# Patient Record
Sex: Male | Born: 1935 | Race: White | Hispanic: No | Marital: Married | Smoking: Former smoker
Health system: Southern US, Community
[De-identification: ages and names within clinical notes are randomized; demographics above are authoritative.]

## PROBLEM LIST (undated history)

## (undated) DIAGNOSIS — I639 Cerebral infarction, unspecified: Secondary | ICD-10-CM

## (undated) DIAGNOSIS — R079 Chest pain, unspecified: Secondary | ICD-10-CM

## (undated) DIAGNOSIS — G4730 Sleep apnea, unspecified: Secondary | ICD-10-CM

## (undated) DIAGNOSIS — E119 Type 2 diabetes mellitus without complications: Secondary | ICD-10-CM

## (undated) DIAGNOSIS — I209 Angina pectoris, unspecified: Secondary | ICD-10-CM

## (undated) DIAGNOSIS — I679 Cerebrovascular disease, unspecified: Secondary | ICD-10-CM

## (undated) DIAGNOSIS — R0602 Shortness of breath: Secondary | ICD-10-CM

## (undated) DIAGNOSIS — R42 Dizziness and giddiness: Secondary | ICD-10-CM

## (undated) DIAGNOSIS — I10 Essential (primary) hypertension: Secondary | ICD-10-CM

## (undated) HISTORY — PX: CARDIAC CATHETERIZATION: SHX172

## (undated) HISTORY — DX: Dizziness and giddiness: R42

## (undated) HISTORY — DX: Type 2 diabetes mellitus without complications: E11.9

## (undated) HISTORY — DX: Cerebrovascular disease, unspecified: I67.9

---
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
## 1999-04-22 ENCOUNTER — Inpatient Hospital Stay (HOSPITAL_COMMUNITY): Admission: AD | Admit: 1999-04-22 | Discharge: 1999-04-24 | Payer: Self-pay | Admitting: Cardiovascular Disease

## 1999-04-22 ENCOUNTER — Encounter: Payer: Self-pay | Admitting: Cardiovascular Disease

## 2000-09-14 ENCOUNTER — Ambulatory Visit (HOSPITAL_BASED_OUTPATIENT_CLINIC_OR_DEPARTMENT_OTHER): Admission: RE | Admit: 2000-09-14 | Discharge: 2000-09-14 | Payer: Self-pay | Admitting: Cardiovascular Disease

## 2002-03-04 ENCOUNTER — Ambulatory Visit (HOSPITAL_BASED_OUTPATIENT_CLINIC_OR_DEPARTMENT_OTHER): Admission: RE | Admit: 2002-03-04 | Discharge: 2002-03-04 | Payer: Self-pay | Admitting: Cardiovascular Disease

## 2002-04-30 ENCOUNTER — Ambulatory Visit (HOSPITAL_BASED_OUTPATIENT_CLINIC_OR_DEPARTMENT_OTHER): Admission: RE | Admit: 2002-04-30 | Discharge: 2002-04-30 | Payer: Self-pay | Admitting: Internal Medicine

## 2003-03-13 ENCOUNTER — Emergency Department (HOSPITAL_COMMUNITY): Admission: EM | Admit: 2003-03-13 | Discharge: 2003-03-13 | Payer: Self-pay | Admitting: Emergency Medicine

## 2003-03-15 ENCOUNTER — Emergency Department (HOSPITAL_COMMUNITY): Admission: EM | Admit: 2003-03-15 | Discharge: 2003-03-16 | Payer: Self-pay | Admitting: Emergency Medicine

## 2003-03-15 ENCOUNTER — Encounter: Payer: Self-pay | Admitting: Emergency Medicine

## 2003-04-08 ENCOUNTER — Encounter: Admission: RE | Admit: 2003-04-08 | Discharge: 2003-04-08 | Payer: Self-pay | Admitting: Cardiovascular Disease

## 2003-04-08 ENCOUNTER — Encounter: Payer: Self-pay | Admitting: Cardiovascular Disease

## 2003-07-29 ENCOUNTER — Ambulatory Visit (HOSPITAL_BASED_OUTPATIENT_CLINIC_OR_DEPARTMENT_OTHER): Admission: RE | Admit: 2003-07-29 | Discharge: 2003-07-29 | Payer: Self-pay | Admitting: Orthopedic Surgery

## 2003-07-29 ENCOUNTER — Ambulatory Visit (HOSPITAL_COMMUNITY): Admission: RE | Admit: 2003-07-29 | Discharge: 2003-07-29 | Payer: Self-pay | Admitting: Orthopedic Surgery

## 2004-08-09 ENCOUNTER — Inpatient Hospital Stay (HOSPITAL_COMMUNITY): Admission: AD | Admit: 2004-08-09 | Discharge: 2004-08-12 | Payer: Self-pay | Admitting: Cardiovascular Disease

## 2005-06-05 ENCOUNTER — Inpatient Hospital Stay (HOSPITAL_COMMUNITY): Admission: AD | Admit: 2005-06-05 | Discharge: 2005-06-08 | Payer: Self-pay | Admitting: Cardiovascular Disease

## 2005-06-28 ENCOUNTER — Encounter: Admission: RE | Admit: 2005-06-28 | Discharge: 2005-06-28 | Payer: Self-pay | Admitting: Cardiovascular Disease

## 2006-06-25 ENCOUNTER — Inpatient Hospital Stay (HOSPITAL_COMMUNITY): Admission: EM | Admit: 2006-06-25 | Discharge: 2006-06-26 | Payer: Self-pay | Admitting: Emergency Medicine

## 2006-10-22 ENCOUNTER — Encounter: Admission: RE | Admit: 2006-10-22 | Discharge: 2006-10-22 | Payer: Self-pay | Admitting: Cardiovascular Disease

## 2007-07-09 ENCOUNTER — Inpatient Hospital Stay (HOSPITAL_COMMUNITY): Admission: AD | Admit: 2007-07-09 | Discharge: 2007-07-12 | Payer: Self-pay | Admitting: Cardiovascular Disease

## 2009-05-06 HISTORY — PX: OTHER SURGICAL HISTORY: SHX169

## 2009-06-23 ENCOUNTER — Observation Stay (HOSPITAL_COMMUNITY): Admission: AD | Admit: 2009-06-23 | Discharge: 2009-06-25 | Payer: Self-pay | Admitting: Cardiovascular Disease

## 2010-12-07 HISTORY — PX: GALLBLADDER SURGERY: SHX652

## 2010-12-08 ENCOUNTER — Ambulatory Visit
Admission: RE | Admit: 2010-12-08 | Discharge: 2010-12-08 | Disposition: A | Discharge status: Home / Self Care | Payer: Medicare Other | Source: Ambulatory Visit | Attending: Cardiovascular Disease | Admitting: Cardiovascular Disease

## 2010-12-08 ENCOUNTER — Inpatient Hospital Stay (HOSPITAL_COMMUNITY)
Admission: AD | Admit: 2010-12-08 | Discharge: 2010-12-14 | DRG: 419 | Disposition: A | Discharge status: Home / Self Care | Payer: Medicare Other | Source: Ambulatory Visit | Attending: General Surgery | Admitting: General Surgery

## 2010-12-08 ENCOUNTER — Observation Stay (HOSPITAL_COMMUNITY): Payer: Medicare Other

## 2010-12-08 ENCOUNTER — Other Ambulatory Visit: Payer: Self-pay | Admitting: Cardiovascular Disease

## 2010-12-08 DIAGNOSIS — K8020 Calculus of gallbladder without cholecystitis without obstruction: Secondary | ICD-10-CM | POA: Diagnosis present

## 2010-12-08 DIAGNOSIS — I10 Essential (primary) hypertension: Secondary | ICD-10-CM | POA: Diagnosis present

## 2010-12-08 DIAGNOSIS — K859 Acute pancreatitis without necrosis or infection, unspecified: Principal | ICD-10-CM | POA: Diagnosis present

## 2010-12-08 DIAGNOSIS — E669 Obesity, unspecified: Secondary | ICD-10-CM | POA: Diagnosis present

## 2010-12-08 DIAGNOSIS — G4733 Obstructive sleep apnea (adult) (pediatric): Secondary | ICD-10-CM | POA: Diagnosis present

## 2010-12-08 DIAGNOSIS — Z7982 Long term (current) use of aspirin: Secondary | ICD-10-CM

## 2010-12-08 DIAGNOSIS — K219 Gastro-esophageal reflux disease without esophagitis: Secondary | ICD-10-CM | POA: Diagnosis present

## 2010-12-09 LAB — CBC
HCT: 43.6 % (ref 39.0–52.0)
Hemoglobin: 14.6 g/dL (ref 13.0–17.0)
WBC: 12.4 10*3/uL — ABNORMAL HIGH (ref 4.0–10.5)

## 2010-12-09 LAB — BASIC METABOLIC PANEL
BUN: 12 mg/dL (ref 6–23)
CO2: 26 mEq/L (ref 19–32)
Calcium: 8.9 mg/dL (ref 8.4–10.5)
Creatinine, Ser: 1.09 mg/dL (ref 0.4–1.5)
GFR calc non Af Amer: >60 mL/min (ref >60)
Glucose, Bld: 126 mg/dL — ABNORMAL HIGH (ref 70–99)

## 2010-12-09 LAB — COMPREHENSIVE METABOLIC PANEL
ALT: 17 U/L (ref 0–53)
AST: 15 U/L (ref 0–37)
Alkaline Phosphatase: 48 U/L (ref 39–117)
CO2: 26 mEq/L (ref 19–32)
Calcium: 8.2 mg/dL — ABNORMAL LOW (ref 8.4–10.5)
Chloride: 98 mEq/L (ref 96–112)
GFR calc Af Amer: >60 mL/min (ref >60)
GFR calc non Af Amer: >60 mL/min (ref >60)
Glucose, Bld: 105 mg/dL — ABNORMAL HIGH (ref 70–99)
Potassium: 3.5 mEq/L (ref 3.5–5.1)
Sodium: 133 mEq/L — ABNORMAL LOW (ref 135–145)

## 2010-12-09 LAB — AMYLASE: Amylase: 59 U/L (ref 0–105)

## 2010-12-10 LAB — COMPREHENSIVE METABOLIC PANEL
BUN: 10 mg/dL (ref 6–23)
Calcium: 8.5 mg/dL (ref 8.4–10.5)
Chloride: 102 mEq/L (ref 96–112)
Creatinine, Ser: 1.08 mg/dL (ref 0.4–1.5)
GFR calc non Af Amer: >60 mL/min (ref >60)
Glucose, Bld: 106 mg/dL — ABNORMAL HIGH (ref 70–99)
Potassium: 3.7 mEq/L (ref 3.5–5.1)
Sodium: 137 mEq/L (ref 135–145)
Total Bilirubin: 0.5 mg/dL (ref 0.3–1.2)

## 2010-12-10 LAB — LIPASE, BLOOD: Lipase: 31 U/L (ref 11–59)

## 2010-12-10 LAB — PROTIME-INR: Prothrombin Time: 13.4 seconds (ref 11.6–15.2)

## 2010-12-11 ENCOUNTER — Inpatient Hospital Stay (HOSPITAL_COMMUNITY): Payer: Medicare Other

## 2010-12-11 LAB — COMPREHENSIVE METABOLIC PANEL
ALT: 20 U/L (ref 0–53)
AST: 19 U/L (ref 0–37)
Albumin: 3.3 g/dL — ABNORMAL LOW (ref 3.5–5.2)
Calcium: 8.8 mg/dL (ref 8.4–10.5)
GFR calc Af Amer: >60 mL/min (ref >60)
Glucose, Bld: 112 mg/dL — ABNORMAL HIGH (ref 70–99)
Sodium: 138 mEq/L (ref 135–145)
Total Protein: 6.5 g/dL (ref 6.0–8.3)

## 2010-12-11 LAB — CBC
Hemoglobin: 14.4 g/dL (ref 13.0–17.0)
MCH: 28.6 pg (ref 26.0–34.0)
MCHC: 33.5 g/dL (ref 30.0–36.0)
MCV: 85.3 fL (ref 78.0–100.0)

## 2010-12-11 LAB — PROTIME-INR: Prothrombin Time: 13.6 seconds (ref 11.6–15.2)

## 2010-12-12 ENCOUNTER — Inpatient Hospital Stay (HOSPITAL_COMMUNITY): Payer: Medicare Other

## 2010-12-12 ENCOUNTER — Other Ambulatory Visit: Payer: Self-pay | Admitting: General Surgery

## 2010-12-12 LAB — SURGICAL PCR SCREEN: MRSA, PCR: NEGATIVE

## 2010-12-13 LAB — BASIC METABOLIC PANEL
GFR calc non Af Amer: >60 mL/min (ref >60)
Glucose, Bld: 97 mg/dL (ref 70–99)
Potassium: 3.9 mEq/L (ref 3.5–5.1)
Sodium: 138 mEq/L (ref 135–145)

## 2010-12-13 LAB — CBC
HCT: 42.8 % (ref 39.0–52.0)
RBC: 4.97 MIL/uL (ref 4.22–5.81)
RDW: 13 % (ref 11.5–15.5)
WBC: 9 10*3/uL (ref 4.0–10.5)

## 2010-12-20 NOTE — Consult Note (Signed)
  NAME:  ALBY, SCHWABE NO.:  [id number]  MEDICAL RECORD NO.:  [id number]           PATIENT TYPE:  I  LOCATION:  5528                         FACILITY:  [facility]  PHYSICIAN:  Gabrielle Dare. Janee Morn, M.D.DATE OF BIRTH:  February 04, 1934  DATE OF CONSULTATION:  12/11/2010 DATE OF DISCHARGE:                                CONSULTATION   CHIEF COMPLAINT:  Pancreatitis.  HISTORY OF PRESENT ILLNESS:  Mr. Romagnoli is a 77 year old white male who saw Dr. Algie Coffer in his office last week for epigastric abdominal pain. He obtained some outpatient lab work which demonstrated elevated lipase and he was admitted to the hospital with pancreatitis.  Workup here has included abdominal ultrasound showing multiple gallstones without evidence of acute cholecystitis.  He was seen in consultation by Dr. Madilyn Fireman from GI.  Lipase and amylase as well as liver function tests have normalized.  He did not require ERCP.  We were asked to evaluate for cholecystectomy.  PAST MEDICAL HISTORY: 1. Hypertension. 2. GERD. 3. Obstructive sleep apnea. 4. Obesity.  PAST SURGICAL HISTORY:  Appendectomy and bilateral knee scope.  SOCIAL HISTORY:  Does not smoke, does not drink alcohol.  He sells boxes part-time.  REVIEW OF SYSTEMS:  GI:  Complaints including epigastric pain have all resolved.  Remainder of the system review was unrevealing.  ALLERGIES:  No known drug allergies.  CURRENT MEDICATIONS:  Please review MAR.  PHYSICAL EXAMINATION:  VITAL SIGNS:  Temperature 97.9, blood pressure 130/76, heart rate 62, respirations 22, saturations 98% on room air. GENERAL:  He is awake and alert.  He is very pleasant. NECK:  Supple. LUNGS:  Clear to auscultation with good respiratory effort. HEART:  Regular with no murmurs. ABDOMEN:  Soft and nontender.  Bowel sounds are present.  No masses are felt.  He has a right lower quadrant scar from his previous appendectomy. SKIN:  Dry. EXTREMITIES:  Have no  significant deformity.  Laboratory studies include INR 1.02, sodium 138, potassium 3.8, chloride 102, CO2 of 23, BUN 8, creatinine 1.11, glucose 112, AST 19, ALT 20, alkaline phosphatase 53, bilirubin 0.6, white blood cell count 7.3, hemoglobin 14.4.  On December 10, 2010, lipase was 31 and amylase was 50.  IMPRESSION AND PLAN:  Resolved biliary pancreatitis.  We will plan laparoscopic cholecystectomy on December 12, 2010.  I will discuss this with my partner, Dr. Gaynelle Adu.  Procedure risks and benefits were discussed in detail with the patient including the possibility of need to convert to open procedure.  Other questions were answered and he is agreeable.     Gabrielle Dare Janee Morn, M.D.     BET/MEDQ  D:  12/11/2010  T:  12/12/2010  Job:  [id number]  cc:   Ricki Rodriguez, M.D. John C. Madilyn Fireman, M.D.  Electronically Signed by Violeta Gelinas M.D. on 12/13/2010 03:56:09 PM

## 2010-12-26 NOTE — Discharge Summary (Signed)
NAME:  Samuel Garza, Samuel Garza NO.:  [id number]  MEDICAL RECORD NO.:  [id number]           PATIENT TYPE:  I  LOCATION:  5528                         FACILITY:  [facility]  PHYSICIAN:  Mary Sella. Andrey Campanile, MD     DATE OF BIRTH:  24-Mar-1934  DATE OF ADMISSION:  12/08/2010 DATE OF DISCHARGE:  12/13/2010                              DISCHARGE SUMMARY   ADMITTING SERVICE:  Ricki Rodriguez, MD  CONSULTANTS:  Everardo All. Madilyn Fireman, MD, Gastroenterology.  [facility] [place] SURGICAL CONSULTANT:  Gabrielle Dare. Janee Morn, MD on December 11, 2010.  PROCEDURES:  Laparoscopic cholecystectomy with intraoperative cholangiogram and placement of a 19-French JP drain per Dr. Gaynelle Adu on December 12, 2010, for biliary pancreatitis.  DISCHARGE DIAGNOSES: 1. Biliary pancreatitis with gallstones without evidence of acute     cholecystitis. 2. Hypertension. 3. Gastroesophageal reflux disease. 4. Obstructive sleep apnea, on Z-Pak prior to admission. 5. Obesity.  HISTORY OF ADMISSION:  This is a pleasant 77 year old white male who was seen by Dr. Algie Coffer in his office on December 08, 2010, with complaints of epigastric abdominal pain.  The patient lab work demonstrated elevated lipase and the patient was admitted to the hospital with suspected pancreatitis.  An abdominal ultrasound was done once he was hospitalized and this showed multiple gallstones without evidence of acute cholecystitis.  Dr. Madilyn Fireman saw the patient in consultation from Gastroenterology and the patient was kept on bowel rest and his lipase and amylase as well as liver function studies normalized without the patient requiring ERCP or other interventions.  We were asked to see the patient on December 11, 2010 for evaluation for cholecystectomy and it was felt that the patient was medically stable and ready for cholecystectomy secondary to his biliary pancreatitis with multiple gallstones.  The patient was taken to the OR on December 12, 2010,  for laparoscopic cholecystectomy with intraoperative cholangiogram and placement of a 19-French JP drain as he required two Endoloops and one clip around the cystic duct.  Gallbladder did have a lot of fat in the wall and was somewhat difficult to remove, but this was able to be accomplished laparoscopically and the gallbladder was sent to pathology and results of this pending at the time of dictation.  The patient has done well postoperatively and is tolerating a regular diet.  He is having minimal complaints of pain.  He has had approximately 20 mL of serosanguineous drainage only from his JP drain with no evidence of bilious drainage clinically.  At this time, the patient is going to mobilize and transitioned over to oral pain medication.  If he continues to do well, he will be discharged later on this afternoon.  The patient will follow up with Dr. Andrey Campanile in 1 week for JP drain removal.  MEDICATIONS AT THE TIME OF DISCHARGE: 1. Percocet 5/325 mg one to two p.o. q.4 h. p.r.n. pain #40 with no     refill. 2. He will resume his usual scheduled medications of vitamin D 1000     units two tablets every other day. 3. Ramipril 5 mg p.o. q.a.m. 4. Prilosec 20 mg p.o. daily.  5. KCL 10 mEq p.o. b.i.d. 6. Meclizine 12.5 mg one tablet b.i.d. 7. Lasix 40 mg p.o. b.i.d. 8. Cervidil 3.125 mg one tablet b.i.d. 9. Baby aspirin 81 mg p.o. daily.  He can also take ibuprofen or     Tylenol as needed for pain.     [name], P.A.   ______________________________ Mary Sella. Andrey Campanile, MD    SR/MEDQ  D:  12/13/2010  T:  12/13/2010  Job:  [id number]  cc:   Ricki Rodriguez, M.D. [facility]  Electronically Signed by Lazaro Arms P.A. on 12/19/2010 03:12:56 PM Electronically Signed by Gaynelle Adu M.D. on 12/26/2010 08:13:51 AM

## 2010-12-26 NOTE — Op Note (Signed)
NAME:  Samuel Garza, Samuel Garza NO.:  [id number]  MEDICAL RECORD NO.:  [id number]           PATIENT TYPE:  I  LOCATION:  5528                         FACILITY:  [facility]  PHYSICIAN:  Mary Sella. Andrey Campanile, MD     DATE OF BIRTH:  [birth year]  DATE OF PROCEDURE:  12/12/2010 DATE OF DISCHARGE:                              OPERATIVE REPORT   PREOPERATIVE DIAGNOSIS:  Biliary pancreatitis.  POSTOPERATIVE DIAGNOSIS:  Biliary pancreatitis.  PROCEDURE:  Laparoscopic cholecystectomy with intraoperative cholangiogram.  SURGEON:  Mary Sella. Andrey Campanile, MD  ASSISTANT SURGEON:  None.  ANESTHESIA:  General plus 17 mL of 0.25% Marcaine with epi.  SPECIMENS:  Gallbladder.  ESTIMATED BLOOD LOSS:  Around 50 mL.  COMPLICATIONS:  None immediately apparent.  FINDINGS:  The patient had a very thickened gallbladder wall that is full of fat.  It did not appear to be actually inflamed.  It was just very infiltrated with adipose tissue.  He had a very large liver making it somewhat difficult to retract up on the gallbladder.  The cystic artery was medial and slightly anteromedial to the cystic duct.  I had to take it down first in order to fully expose the cystic duct.  The cystic duct was dilated as well.  We ended up transecting the gallbladder and put an Endoloop around the specimen side of the gallbladder, so we would not have any spillage of gallstones.  Then using an alligator grasp up on the transected infundibulum and lifted it up and then I was able to thread a Reddick cholangiogram catheter into the neck of the gallbladder down the cystic duct and the balloon.  The cholangiogram demonstrated prompt opacification of the cystic duct remnant, the common bile duct, left and right hepatic ducts as well as emptying into the duodenum.  There was no evidence of filling defect. Because the gallbladder had been transected at the infundibulum at the junction of the cystic duct confluence with the  gallbladder, I elected to place 2 Endoloops around it as well as one clip.  I was concerned that the clip would not go completely across it, therefore I placed 2 Endoloops.  There was no sign of bile leak at the end; however, I did leave a drain in the right upper quadrant.  INDICATIONS FOR PROCEDURE:  The patient is a 77 year old obese gentleman that developed epigastric pain, late last Sunday.  The pain worsened. He was worked up as an outpatient, was found to have elevated lipase, and was admitted where an ultrasound was done that showed multiple gallstones without any evidence of choledocholithiasis.  His LFTs normalized.  We discussed the risks and benefits of surgery including bleeding, infection, injury to surrounding structures, injury to the common bile duct requiring major reconstructive bile duct surgery, prolonged diarrhea, DVT occurrence, need to convert to an open procedure, bile leak, incisional hernia, and pulmonary complications. The patient elected to proceed with surgery.  After obtaining informed consent, the patient was brought to the operating room, placed supine on the operating table.  General endotracheal anesthesia was established. He received antibiotics prior to skin incision.  His abdomen  was prepped and draped in usual standard surgical fashion.  Surgical time-out was performed.  Sequential compression device had been placed.  He had a very obese protuberant abdomen.  I elected to place the camera slightly above his umbilicus.  Local was infiltrated just above his umbilicus. Next, a vertical 1-inch incision was made with an #11 blade.  The fascia was grasped with Kochers x2 and lifted anteriorly.  Next, the fascia was incised and the abdominal cavity was entered.  Next, a pursestring suture consisting of 0-Vicryl UR-6 needle was placed around the fascial edges to create a pursestring suture.  The Hasson trocar was placed on direct visualization into the  abdominal cavity.  Pneumoperitoneum was smoothly established up to a patient pressure of 15 mmHg.  Laparoscope was advanced.  His liver was above his right subcostal margin and is very large as well.  We placed them in steep reverse Trendelenburg and rotated slightly to the left.  Three additional 5-mm trocars were placed, one in the subxiphoid and two in the right hypochondrium; all under direct visualization after local had been placed.  The tip of the gallbladder was grasped and retracted to the right shoulder.  His liver was very heavy.  In trying to get some traction on the gallbladder, the wall tore with spillage of bile.  There was no evidence of spillage of gallstones.  The bile was evacuated.  I then re-grasped the gallbladder so that the tear was occluded in the jaws of the grasper.  He had a very thick peritoneum.  I incised it medially and laterally with hook electrocautery.  Essentially this was all fat.  I was able to then grab what appeared to be the neck.  I had to retract down on the colon to get it out of the way to retract it out of the away.  I tried incising the peritoneum both medially and laterally in the gallbladder.  Again there was just a ton of fat around in the wall of the gallbladder.  A small rent was made in the body of the gallbladder in trying to achieve extra tension on the gallbladder.  There was some spillage of bile; however, there was no spillage of gallstones.  Then using the suction irrigator catheter, I did some blunt dissection with the suction irrigator catheter.  I identified a tubular structure entering the gallbladder that appeared to be anterior to the cystic duct.  It was circumferentially dissected out as it was very long, narrow.  It was pulsatile.  As described above, it was anterior and medial to what appeared to be the cystic duct.  In order to get behind this structure, I felt I needed to take it down.  Two clips were placed proximally  and one distally.  It was then transected with Endoshears.  I then was able to create a window and achieve critical view, although the cystic artery had been already ligated.  The infundibulum neck was very long.  I identified a tubular structure, which I had previously identified as cystic duct going into the gallbladder as it was very long.  I tried to tease it out and skinny it out with hook electrocautery as well as with blunt dissection.  At this point, I was within the body of the gallbladder.  I could trace down where the cystic duct started at the neck of the gallbladder.  There was nothing else behind it.  There was nothing else entering the gallbladder.  At this point,  I introduced a cholangiogram catheter as described above.  The cholangiogram was performed as described above.  The cholangiogram catheter was removed. Because I had transected just where the duct entered the neck of the gallbladder, the clip barely went entirely across it.  At this point, I decided to place two Endoloops to completely occlude the distal cystic duct where it entered the neck of the gallbladder.  I then rolled the gallbladder up out of the gallbladder fossa and separated from the liver using hook electrocautery.  The gallbladder was freed.  An endobag was advanced through the umbilical trocar and the gallbladder was placed within the bag.  The gallbladder was removed along with the specimen bag and pneumoperitoneum was reestablished.  The right upper quadrant and gallbladder fossa was irrigated and hemostasis was achieved.  There was no evidence of bleeding or bile leak.  I irrigated the right upper quadrant with 2 L of saline.  There was no evidence of bleeding or bile leak.  I elected to leave a 19-French drain.  I introduced it through the subxiphoid trocar and brought it out through the right lateral abdominal wall trocar.  It was secured to the skin with a 3-0 nylon. The drain was placed in  the gallbladder fossa.  The patient was then placed in a supine position.  I removed all remaining irrigation fluid. The Hasson trocar was removed and I tied down the previously placed pursestring suture.  There was a small air leak, therefore I placed another single interrupted 0-Vicryl suture on the UR-6 needle.  This obliterated the air leak.  There was nothing to our closure as it was viewed laparoscopically.  Remaining trocars were removed.  Pneumoperitoneum was released.  All skin incisions were closed with 4-0 Monocryl in subcuticular fashion.  Benzoin, Steri-Strips, and sterile bandages.  The patient tolerated the procedure well and there were no immediate complications.     Mary Sella. Andrey Campanile, MD     EMW/MEDQ  D:  12/12/2010  T:  12/13/2010  Job:  [id number]  cc:   Everardo All. Madilyn Fireman, M.D. Ricki Rodriguez, M.D.  Electronically Signed by Gaynelle Adu M.D. on 12/26/2010 08:16:20 AM

## 2011-01-12 NOTE — Consult Note (Signed)
  NAME:  KWALI, WRINKLE NO.:  [id number]  MEDICAL RECORD NO.:  [id number]           PATIENT TYPE:  I  LOCATION:  5507                         FACILITY:  [facility]  PHYSICIAN:  Keishla Oyer C. Madilyn Fireman, M.D.    DATE OF BIRTH:  04/07/1934  DATE OF CONSULTATION:  12/09/2010 DATE OF DISCHARGE:                                CONSULTATION   REASON FOR CONSULTATION:  Pancreatitis presumed secondary to gallstones.  HISTORY OF PRESENT ILLNESS:  The patient is a 77 year old white male who presents with what initially began as chest and epigastric pain, which radiated into the low epigastrium and periumbilical area with nausea worsening over about the last 3-4 days.  He had outpatient blood work yesterday, which supposedly showed an elevated lipase, although I do not have the exact value.  He was admitted yesterday and had an ultrasound done, which showed multiple gallstones up to 1.8 cm with no gallbladder dilatation, wall thickening, or pericholecystic fluid.  The common bile duct was within normal limits at 6 mm, pancreas was not well visualized. He has not had any liver function tests or lipase drawn since he has been here.  He has no drinking history.  No known previous history of gallstones or pancreatic problems.  He denies any vomiting.  He denies any weight loss.  PAST MEDICAL HISTORY:  Hypertension, obesity, sleep apnea, and gastroesophageal reflux.  ALLERGIES:  None.  FAMILY HISTORY:  The patient is married.  He has 4 daughters.  He denies alcohol or tobacco use.  FAMILY HISTORY:  Negative for GI malignancy or pancreatic disease.  MEDICATIONS:  Currently not available.  PHYSICAL EXAMINATION:  GENERAL:  Obese white male in no acute distress. HEART:  Regular rate and rhythm without murmurs. LUNGS:  Clear. ABDOMEN:  Soft, mildly distended with normoactive bowel sounds.  No hepatosplenomegaly or mass.  There is mild diffuse epigastric and periumbilical tenderness with  minimal guarding.  LABORATORY DATA:  Hemoglobin 14.6 and WBC 12,400.  BMET is normal.  IMPRESSION: 1. Reported pancreatitis based on elevated lipase. 2. Multiple gallstones, suspect etiology of pancreatitis.  PLAN:  We will obtain liver function tests, amylase, and lipase and consider MRCP, ERCP, EUS etc., if retained common bile duct stone suspected; otherwise after his conservative management, consult Surgery for possible laparoscopic cholecystectomy.          ______________________________ Everardo All Madilyn Fireman, M.D.     JCH/MEDQ  D:  12/09/2010  T:  12/09/2010  Job:  [id number]  Electronically Signed by Dorena Cookey M.D. on 01/10/2011 07:07:21 PM

## 2011-02-11 LAB — CARDIAC PANEL(CRET KIN+CKTOT+MB+TROPI)
CK, MB: 1.1 ng/mL (ref 0.3–4.0)
CK, MB: 1.1 ng/mL (ref 0.3–4.0)
CK, MB: 1.6 ng/mL (ref 0.3–4.0)
Relative Index: 1.4 (ref 0.0–2.5)
Relative Index: INVALID (ref 0.0–2.5)
Troponin I: 0.01 ng/mL (ref 0.00–0.06)
Troponin I: 0.02 ng/mL (ref 0.00–0.06)
Troponin I: 0.02 ng/mL (ref 0.00–0.06)

## 2011-02-11 LAB — DIFFERENTIAL
Basophils Absolute: 0.1 10*3/uL (ref 0.0–0.1)
Eosinophils Relative: 2 % (ref 0–5)
Lymphocytes Relative: 23 % (ref 12–46)
Lymphs Abs: 2.1 10*3/uL (ref 0.7–4.0)
Neutro Abs: 5.9 10*3/uL (ref 1.7–7.7)
Neutrophils Relative %: 65 % (ref 43–77)

## 2011-02-11 LAB — LIPID PANEL
Cholesterol: 146 mg/dL (ref 0–200)
HDL: 35 mg/dL — ABNORMAL LOW (ref >39)
Triglycerides: 104 mg/dL (ref <150)

## 2011-02-11 LAB — COMPREHENSIVE METABOLIC PANEL
AST: 22 U/L (ref 0–37)
BUN: 7 mg/dL (ref 6–23)
CO2: 28 mEq/L (ref 19–32)
Calcium: 8.9 mg/dL (ref 8.4–10.5)
Chloride: 103 mEq/L (ref 96–112)
Creatinine, Ser: 0.87 mg/dL (ref 0.4–1.5)
GFR calc Af Amer: >60 mL/min (ref >60)
GFR calc non Af Amer: >60 mL/min (ref >60)
Glucose, Bld: 103 mg/dL — ABNORMAL HIGH (ref 70–99)
Total Bilirubin: 0.6 mg/dL (ref 0.3–1.2)

## 2011-02-11 LAB — CBC
Hemoglobin: 14.5 g/dL (ref 13.0–17.0)
RDW: 13.5 % (ref 11.5–15.5)

## 2011-02-11 LAB — TSH: TSH: 2.4 u[IU]/mL (ref 0.350–4.500)

## 2011-02-14 ENCOUNTER — Emergency Department (HOSPITAL_COMMUNITY): Payer: Medicare Other

## 2011-02-14 ENCOUNTER — Emergency Department (HOSPITAL_COMMUNITY)
Admission: EM | Admit: 2011-02-14 | Discharge: 2011-02-14 | Disposition: A | Discharge status: Home / Self Care | Payer: Medicare Other | Attending: Emergency Medicine | Admitting: Emergency Medicine

## 2011-02-14 DIAGNOSIS — Z79899 Other long term (current) drug therapy: Secondary | ICD-10-CM | POA: Insufficient documentation

## 2011-02-14 DIAGNOSIS — R000 Tachycardia, unspecified: Secondary | ICD-10-CM | POA: Insufficient documentation

## 2011-02-14 DIAGNOSIS — R112 Nausea with vomiting, unspecified: Secondary | ICD-10-CM | POA: Insufficient documentation

## 2011-02-14 DIAGNOSIS — R509 Fever, unspecified: Secondary | ICD-10-CM | POA: Insufficient documentation

## 2011-02-14 DIAGNOSIS — R109 Unspecified abdominal pain: Secondary | ICD-10-CM | POA: Insufficient documentation

## 2011-02-14 DIAGNOSIS — R197 Diarrhea, unspecified: Secondary | ICD-10-CM | POA: Insufficient documentation

## 2011-02-14 DIAGNOSIS — I10 Essential (primary) hypertension: Secondary | ICD-10-CM | POA: Insufficient documentation

## 2011-02-14 LAB — DIFFERENTIAL
Basophils Relative: 0 % (ref 0–1)
Eosinophils Absolute: 0 10*3/uL (ref 0.0–0.7)
Eosinophils Relative: 0 % (ref 0–5)
Lymphs Abs: 1 10*3/uL (ref 0.7–4.0)
Monocytes Relative: 12 % (ref 3–12)

## 2011-02-14 LAB — URINALYSIS, ROUTINE W REFLEX MICROSCOPIC
Glucose, UA: NEGATIVE mg/dL
Leukocytes, UA: NEGATIVE
Specific Gravity, Urine: 1.025 (ref 1.005–1.030)
pH: 6 (ref 5.0–8.0)

## 2011-02-14 LAB — COMPREHENSIVE METABOLIC PANEL
Albumin: 3.6 g/dL (ref 3.5–5.2)
BUN: 15 mg/dL (ref 6–23)
Creatinine, Ser: 1.4 mg/dL (ref 0.4–1.5)
GFR calc Af Amer: 60 mL/min — ABNORMAL LOW (ref >60)
Total Protein: 6.7 g/dL (ref 6.0–8.3)

## 2011-02-14 LAB — CBC
MCH: 28.1 pg (ref 26.0–34.0)
MCV: 85.7 fL (ref 78.0–100.0)
Platelets: 209 10*3/uL (ref 150–400)
RDW: 13.7 % (ref 11.5–15.5)
WBC: 11.7 10*3/uL — ABNORMAL HIGH (ref 4.0–10.5)

## 2011-02-14 LAB — URINE MICROSCOPIC-ADD ON

## 2011-02-14 MED ORDER — IOHEXOL 300 MG/ML  SOLN
100.0000 mL | Freq: Once | INTRAMUSCULAR | Status: AC | PRN
  Administered 2011-02-14: 100 mL via INTRAVENOUS

## 2011-02-15 LAB — URINE CULTURE: Colony Count: NO GROWTH

## 2011-03-21 NOTE — Discharge Summary (Signed)
NAME:  Samuel Garza, Samuel Garza NO.:  [id number]   MEDICAL RECORD NO.:  [id number]          PATIENT TYPE:  OBV   LOCATION:  3703                         FACILITY:  [facility]   PHYSICIAN:  Ricki Rodriguez, M.D.  DATE OF BIRTH:  12-28-1933   DATE OF ADMISSION:  06/23/2009  DATE OF DISCHARGE:  06/25/2009                               DISCHARGE SUMMARY   FINAL DIAGNOSES:  1. Dizziness.  2. Atypical chest pain.  3. Hypertension.  4. Obesity.   DISCHARGE MEDICATIONS:  1. Aspirin enteric coated 81 mg one daily.  2. Carvedilol 3.125 mg one twice daily.  3. Lasix 40 mg one daily.  4. Meclizine 12.5 mg 3 times daily as needed.  5. Metamucil 1 scoop in the morning.  6. Potassium 10 mEq one twice daily.  7. Prilosec 20 mg daily.  8. Ramipril 5 mg one daily.   DISCHARGE DIET:  Heart-healthy diet.   DISCHARGE ACTIVITY:  The patient to increase activity slowly.   FOLLOWUP:  By Dr. Orpah Cobb in 2 weeks.   HISTORY:  This is a 77 year old white male presented with 2-day history  of recurrent weakness, dizziness, and sweating spells.  The patient had  knee surgery done about a month ago.  The patient denied any nausea,  vomiting, or GI bleed.   PHYSICAL EXAMINATION:  VITAL SIGNS:  Temperature 97.9, pulse 72,  respirations 18, blood pressure 133/66, oxygen saturation 98% on room  air.  GENERAL:  The patient is averagely built, but over nourished in no acute  distress.  HEENT:  The patient is normocephalic, atraumatic.  NECK:  Supple.  No JVD.  LUNGS:  Clear bilaterally.  HEART:  Normal S1 and S2.  ABDOMEN:  Soft, distended, but nontender.  EXTREMITIES:  No cyanosis or clubbing.  1+ edema.  NEUROLOGIC:  The patient was alert and oriented x3.  Cranial nerves  grossly intact.   LABORATORY DATA:  Normal hemoglobin, hematocrit, WBC count, and platelet  count.  Normal electrolytes, BUN, and creatinine.  Normal CK-MB and  troponin I x3.  Thyroid stimulating hormone normal at 2.4.   Cholesterol  normal at 146, HDL cholesterol slightly low at 35, LDL cholesterol of  90.   CT of the head without contrast showed mild atrophy.  No acute  intracranial abnormality.   HOSPITAL COURSE:  The patient was admitted to telemetry unit.  Myocardial infarction was ruled out.  He underwent nuclear stress test  that failed to show any reversible ischemia.  There was no change made  in his medications, however, most of the medications were held for 48  hours.  The patient's condition remained stable.  Hence, he was  discharged home in satisfactory condition with followup by me in 2  weeks.      Ricki Rodriguez, M.D.  Electronically Signed     ASK/MEDQ  D:  06/25/2009  T:  06/26/2009  Job:  [id number]

## 2011-03-21 NOTE — H&P (Signed)
NAME:  Samuel Garza, Samuel Garza NO.:  [id number]   MEDICAL RECORD NO.:  [id number]          PATIENT TYPE:  INP   LOCATION:  4729                         FACILITY:  [facility]   PHYSICIAN:  Ricki Rodriguez, M.D.  DATE OF BIRTH:  May 21, 1934   DATE OF ADMISSION:  07/09/2007  DATE OF DISCHARGE:                              HISTORY & PHYSICAL   CHIEF COMPLAINT:  Shortness of breath and leg edema.   HISTORY OF PRESENT ILLNESS:  This 77 year old obese white male has been  noticing bilateral leg edema and increasing shortness of breath for the  last 1 week.  The patient denies any chest pain.  No nausea,  vomiting,  fever, cough, or dizziness.   PAST MEDICAL HISTORY:  Positive for hypertension for 7 years.  No  history of diabetes or elevated cholesterol level.  A positive history of premature coronary artery disease in the family,  obesity, and the patient has sleep apnea.   PAST SURGICAL HISTORY:  Appendectomy 60 years ago.   MEDICATIONS:  1. Aspirin 81 mg daily.  2. Antivert 12.5 mg 3 times daily as needed.  3. Altace 5 mg daily.  4. Coreg 3.125 mg twice daily.  5. Prevacid 30 mg or Nexium 40 mg daily.   ALLERGIES:  None.   PERSONAL HISTORY:  The patient is married.  Wife, Cordelia Pen, is 88 years-  old.  The patient has four daughters, ages  55-50 years.   FAMILY HISTORY:  Mother died at age 81 of myocardial infarction and  father died of stroke at age 59.  The patient has eight brothers and six  sisters and all are living except for two sisters.   REVIEW OF SYSTEMS:  The patient admits to recurrent weight gain and  dietary noncompliance.  Denies hemoptysis, hematemesis.  Has occasional  chest pains.  No history of GI bleed, diarrhea,  or constipation.  A  positive history of nausea and vomiting.  A negative history of kidney  stones, seizure disorder.  The patient had a small cerebrovascular  accident many years ago.  No significant history of joint pain or skin  rash.   PHYSICAL EXAMINATION:  GENERAL:  The patient is a well-built, well-  nourished white male in mild respiratory distress.  VITAL SIGNS:  Pulse 65.  Respirations 18.  Blood pressure 130/60.  Height approximately 5 feet 8 inches.  Weight approximately 290 pounds.  HEENT:  The patient is normocephalic, atraumatic.  Has blue-green eyes.  Wears glasses.  Pupils are equal and reactive to light.  Extraocular  movements are intact.  Mucous membranes are pink and moist.  NECK:  No JVD.  No carotid bruit.  LUNGS:  Decreased air entry both lower lobes.  HEART:  Normal S1-S2 with grade 2/6 systolic murmur.  ABDOMEN:  Distended, soft, with a large ventral hernia.  EXTREMITIES:  2+ edema.  No cyanosis or clubbing.  CNS:  Cranial nerves grossly intact.  The patient moves all four  extremities.   LABORATORY DATA:  Normal hemoglobin and hematocrit, WBC count, and  platelet count.  INR 1.0.  Normal electrolytes.  Sugar 100.  BUN and  creatinine are normal.  Liver enzymes are normal.  CK-MB, troponin-I,  normal.  B-natriuretic peptide less than 30.   IMPRESSION:  1. Bilateral leg edema, rule out congestive heart failure.  2. Obesity.  3. Hypertension.   PLAN:  The plan is to admit the patient to a telemetry bed.  Give IV  Lasix restrict fluid intake.  Check cardiac enzymes to rule out  myocardial infarction.      Ricki Rodriguez, M.D.  Electronically Signed     ASK/MEDQ  D:  07/09/2007  T:  07/10/2007  Job:  [id number]

## 2011-03-24 NOTE — H&P (Signed)
NAME:  Samuel Garza, Samuel Garza NO.:  [id number]   MEDICAL RECORD NO.:  [id number]          PATIENT TYPE:  INP   LOCATION:  3707                         FACILITY:  [facility]   PHYSICIAN:  Ricki Rodriguez, M.D.  DATE OF BIRTH:  1934-06-18   DATE OF ADMISSION:  08/09/2004  DATE OF DISCHARGE:                                HISTORY & PHYSICAL   CHIEF COMPLAINT:  Weakness and dizziness.   HISTORY OF PRESENT ILLNESS:  This 77 year old white male complained of  weakness, dizziness, and bilateral leg swelling without any chest pain, but  shortness of breath with activity and lying down.   PAST MEDICAL HISTORY:  Hypertension for a few years now.  No history of  diabetes or elevated cholesterol level.  Family history of premature  coronary artery disease.  History of obesity and history of sleep apnea.   PAST SURGICAL HISTORY:  Appendectomy 60 years ago.   MEDICATIONS:  1.  Norvasc 5 mg one p.o. daily.  2.  Nexium 40 mg one p.o. daily.  3.  Enteric-coated aspirin 81 mg one daily.  4.  Darvocet-N 100 four times daily.   ALLERGIES:  None.   PERSONAL HISTORY:  The patient is married, wife is 40.  He has four  daughters between the ages of 64 to 65 years and has eight brothers and no  sisters.   FAMILY HISTORY:  Mother died age of 90 of myocardial infarction.  Father  died of stroke at age 46.   REVIEW OF SYSTEMS:  The patient admits to weight gain, dietary  noncompliance.  Has history of cerebrovascular accident many years ago.  No  history of bronchitis, asthma, chest pain, hepatitis, nausea, vomiting,  diarrhea, GI bleed, kidney stone, or seizure disorder.   PHYSICAL EXAMINATION:  VITAL SIGNS:  Pulse 80, respirations 18, blood  pressure 140/74, height 5 feet 8 inches, weight 254 pounds.  GENERAL:  The patient is alert, oriented x3.  HEENT:  The patient wears glasses, has blue-green eyes.  Pupils equally  reactive to light, extraocular movement intact.  The patient also  wears a  hearing aid.  Right tympanic membrane had mild swelling and erythema  compared to the left tympanic membrane.  NECK:  No JVD, no carotid bruit.  LUNGS:  Clear.  HEART:  Normal S1, S2.  Grade 2/6 systolic murmur.  ABDOMEN:  Soft, distended, with a large ventral hernia.  EXTREMITIES:  Show 2+ edema, no cyanosis or clubbing.  NEUROLOGIC:  Grossly intact with bilateral equal grips.   LABORATORY DATA:  Normal hemoglobin, hematocrit, wbc count, platelet count.  Normal electrolytes, BUN, creatinine.  CK 220, MB 2.3, troponin I 0.01.  EKG:  Normal sinus rhythm.  Chest x-ray:  Positive for pulmonary vascular  congestion.   ASSESSMENT:  1.  Weakness, dizziness, and otitis media.  2.  Leg edema with probably congestive heart failure, rule out myocardial      infarction.   PLAN:  Admit the patient to rule out MI.  Give IV Lasix, and give ear  solutions, Antivert, and home medications.  ASK/MEDQ  D:  08/10/2004  T:  08/10/2004  Job:  [id number]

## 2011-03-24 NOTE — H&P (Signed)
NAME:  Samuel Garza, Samuel Garza NO.:  [id number]   MEDICAL RECORD NO.:  [id number]          PATIENT TYPE:  INP   LOCATION:  3711                         FACILITY:  [facility]   PHYSICIAN:  Ricki Rodriguez, M.D.  DATE OF BIRTH:  August 27, 1934   DATE OF ADMISSION:  06/25/2006  DATE OF DISCHARGE:                                HISTORY & PHYSICAL   CHIEF COMPLAINT:  Dizziness and nausea and vomiting.   HISTORY OF PRESENT ILLNESS:  This is a 77 year old white male who had  dizziness at work at 3:30 p.m.  He drove home and got sick at his stomach  with nausea and vomiting.  Wife thought he was white as a ghost.  He has  also noticed vertigo with change in position, more so in lying down in the  bed.  Prior to this, he had seen ENT doctor last week, who cleaned out his  ears and had prescribed Cortisporin ear drops and was apparently feeling  better.   Past medical history reveals hypertension for 6 years and no history of  diabetes or elevated cholesterol level.  Positive history of premature  coronary artery disease in family, obesity and sleep apnea.   PAST SURGICAL HISTORY:  Appendectomy 60 years ago.   CURRENT MEDICATIONS:  1. Altace 5 mg one daily.  2. Coreg 3.125 mg twice daily.  3. Nexium 40 mg daily.  4. Enteric-coated aspirin 81 mg daily.   ALLERGIES:  None.   PERSONAL HISTORY:  The patient is married.  Wife, Cordelia Pen, is 63 years old.  The patient has 4 daughters, ages 30-49 years.   FAMILY HISTORY:  Mother died at age 79 of myocardial infarction and father  died of stroke at age 31 also.  The patient has 8 brothers and 6 sisters and  all are living except for 2 sisters.   REVIEW OF SYSTEMS:  The patient admits to recurrent weight gain and dietary  noncompliance, denies hemoptysis, hematemesis, bronchitis, occasional chest  pains.  No history of hepatitis.  Positive history of nausea and vomiting.  Negative history of diarrhea or GI bleed, kidney stone or seizure  disorder.  Had a small cerebrovascular accident many years ago.  Denies significant  joint pains or skin rash.   PHYSICAL EXAMINATION:  GENERAL:  The patient is well-built, well-nourished  white male in mild distress.  VITAL SIGNS:  Pulse 70, respirations 15, blood pressure 130/60.  Height 5  feet 8 inches.  Weight approximately 270 pounds.  GENERAL:  The patient is alert and oriented x3.  HEENT:  The patient has blue-green eyes and wears glasses.  Pupils are  equal, reacting to light.  Extraocular movement intact.  The patient is  normocephalic, atraumatic and with mucous membranes pink and moist.  NECK:  No JVD.  No carotid bruit.  LUNGS:  Clear bilaterally.  HEART:  Normal S1 and S2 with a grade 2/6 systolic murmur.  ABDOMEN:  Distended, soft.  Has large ventral hernia.  EXTREMITIES:  Edema 2+.  No cyanosis or clubbing.  CNS:  Cranial nerves grossly intact.  The patient moves  all 4 extremities.   LABORATORY DATA:  Pending.   IMAGING STUDY:  CT of the brain:  No acute bleed.   IMPRESSION:  1. Dizziness and presyncope.  2. Positional vertigo.  3. Hypertension.  4. Obesity.   PLAN:  Admit the patient to a telemetry unit, rule out cardiac dysrhythmia,  give IV Zofran for nausea and vomiting control, get CT of the brain to rule  out acute bleed.      Ricki Rodriguez, M.D.  Electronically Signed     ASK/MEDQ  D:  06/25/2006  T:  06/26/2006  Job:  [id number]

## 2011-03-24 NOTE — H&P (Signed)
NAME:  Samuel Garza, Samuel Garza NO.:  [id number]   MEDICAL RECORD NO.:  [id number]          PATIENT TYPE:  INP   LOCATION:  4711                         FACILITY:  [facility]   PHYSICIAN:  Ricki Rodriguez, M.D.  DATE OF BIRTH:  Sep 10, 1934   DATE OF ADMISSION:  06/05/2005  DATE OF DISCHARGE:                                HISTORY & PHYSICAL   CHIEF COMPLAINT:  Left leg redness, swelling, and pain.   HISTORY OF PRESENT ILLNESS:  This 77 year old white male presented with a  two-day history of left leg redness along with mild fever, pain, and  swelling.  The patient denied any chest pain.  Admitted to some shortness of  breath with activity.   PAST MEDICAL HISTORY:  Hypertension for 5 years.  No history of diabetes or  elevated cholesterol level.   Positive family history of premature coronary artery disease, obesity, and  sleep apnea.   PAST SURGICAL HISTORY:  Appendectomy 60 years ago.   CURRENT MEDICATIONS:  1.  Norvasc 5 mg 1 p.o. daily.  2.  Nexium 40 mg 1 daily.  3.  Enteric-coated aspirin 81 mg 1 daily.  4.  Darvocet-N 100 four times daily as needed.   ALLERGIES:  None.   PERSONAL HISTORY:  The patient is married, wife 62 years old.  The patient  has four daughters, ages 53 and 36 years.   FAMILY HISTORY:  Mother died at age 53 of myocardial infarction, and father  died of stroke at age 69.  The patient has eight brothers and no sisters.   REVIEW OF SYSTEMS:  The patient admits to weight gain, dietary  noncompliance.  No history of bronchitis, as mild chest pain, hepatitis,  nausea, vomiting, diarrhea, GI bleed, kidney stone, or seizure disorder.  He  has a positive history of cerebrovascular accident many years ago.  He wears  glasses.   PHYSICAL EXAMINATION:  VITAL SIGNS:  Pulse 72, respirations 60, blood  pressure 130/72.  Height 5 feet 8 inches, weight 263 pounds.  GENERAL:  Alert and oriented x 3 in mild distress.  HEENT:  The patient wears glasses, has  blue/green eyes.  Pupils equal and  reactive to light.  Extraocular movements intact.  Normocephalic and  atraumatic.  Mucous membranes pink and moist.  NECK:  4 cm JVD at 90 degree angle.  Has wax in the left ear.  LUNGS:  Clear bilaterally.  HEART:  Normal S1 and S2 with grade 2/6 systolic murmur.  ABDOMEN:  Distended, soft, with a large ventral hernia.  EXTREMITIES:  2 to 3+ edema.  No cyanosis or clubbing. The left lower leg  has 6 x 9 inch area of redness with mild tenderness.  CNS:  Cranial nerves grossly intact, and patient moves all four extremities.   LABORATORY DATA:  Normal hemoglobin, hematocrit, WBC, platelet count.  Normal electrolytes, BUN, creatinine. Sugar borderline at 107.  B-natruretic  peptide was less than 30.  Blood cultures pending.   IMPRESSION:  1.  Left leg cellulitis.  2.  Hypertension.  3.  Obesity.  4.  Bilateral lower extremity  edema.   PLAN:  1.  Admit the patient to telemetry unit.  2.  Give IV Lasix.  3.  After blood cultures, start IV Rocephin.  4.  Convert to oral antibiotic if patient responds in 24 to 48 hours.  5.  Continue home medications of Norvasc, aspirin, and Tylenol as needed.  6.  Check BMET.      Ricki Rodriguez, M.D.  Electronically Signed     ASK/MEDQ  D:  06/05/2005  T:  06/05/2005  Job:  [id number]

## 2011-03-24 NOTE — Discharge Summary (Signed)
NAME:  Samuel Garza, Samuel Garza               ACCOUNT NO.:  [id number]   MEDICAL RECORD NO.:  [id number]          PATIENT TYPE:  INP   LOCATION:  3707                         FACILITY:  [facility]   PHYSICIAN:  Ricki Rodriguez, M.D.  DATE OF BIRTH:  [birth year]   DATE OF ADMISSION:  08/09/2004  DATE OF DISCHARGE:  08/12/2004                                 DISCHARGE SUMMARY   PRINCIPAL DIAGNOSES:  1.  Congestive heart failure.  2.  Hypertension.  3.  Obesity.  4.  Otitis media.   DISCHARGE MEDICATIONS:  1.  Aspirin 81 mg 1 p.o. daily.  2.  Plavix 75 mg one daily.  3.  Nexium 40 mg one daily.  4.  Norvasc 5 mg one daily.  5.  Darvocet-N 100 one q.i.d. as needed.   PAIN MANAGEMENT:  As above.   ACTIVITY:  As tolerated.  No heavy-duty lifting, pulling, pushing.   DISCHARGE DIET:  Lowfat, low salt diet.   FOLLOWUP:  By Dr. Orpah Cobb in two to four weeks.   HISTORY:  This 77 year old white male was admitted with a complaint of  weakness, dizziness, bilateral leg swelling, without chest pain, but with  shortness of breath with activity, and also with shortness of breath on  lying down.   PAST MEDICAL HISTORY:  1.  Hypertension.  2.  Sleep apnea.  3.  Obesity.  4.  Negative for diabetes, smoking, and alcohol intake.   PHYSICAL EXAMINATION:  PULSE:  80.  RESPIRATIONS:  18.  BLOOD PRESSURE:  140/74.  HEIGHT:  5 feet 8 inches.  WEIGHT:  254 pounds.  GENERAL:  The patient was alert, oriented x 3.  HEENT:  Patient having blue-green eyes, wearing glasses.  Pupils equal and  reactive to light.  Extraocular movements intact.  Also wearing hearing aid,  and right tympanic membrane with mild swelling.  NECK:  No JVD, no carotid bruits.  LUNGS:  Clear bilaterally.  HEART:  Normal S1, S2, with grade 2/6 systolic murmur.  ABDOMEN:  Soft, distended, with a ventral hernia.  EXTREMITIES:  2+ edema, without cyanosis or clubbing.  CNS:  Grossly intact, and bilateral equal grips.   LABORATORY DATA:   Normal hemoglobin, hematocrit, WBC count, platelet count.  Normal electrolytes, BUN, creatinine, and glucose.  Normal CK, MB, and  troponin I.  Lipid profile:  Cholesterol 180, triglycerides 74, HDL  cholesterol 46, LDL cholesterol 119.   Nuclear stress test negative for myocardial ischemia.   HOSPITAL COURSE:  The patient was admitted to the telemetry unit.  Myocardial infarction was ruled out.  He received IV Lasix, with significant  diuresis.  His condition improved in 24 hours.  He underwent a nuclear  stress test that failed to show any reversible ischemia.  His activity was  increased, and he was discharged home on August 12, 2004 in satisfactory  condition, with followup by me in two weeks.      Ajay   ASK/MEDQ  D:  09/28/2004  T:  09/28/2004  Job:  [id number]

## 2011-03-24 NOTE — Discharge Summary (Signed)
NAME:  Samuel Garza, Samuel Garza NO.:  [id number]   MEDICAL RECORD NO.:  [id number]          PATIENT TYPE:  INP   LOCATION:  4711                         FACILITY:  [facility]   PHYSICIAN:  Ricki Rodriguez, M.D.  DATE OF BIRTH:  12-Jan-1934   DATE OF ADMISSION:  06/05/2005  DATE OF DISCHARGE:  06/08/2005                                 DISCHARGE SUMMARY   PRINCIPAL DIAGNOSIS:  1.  Cellulitis of the leg.  2.  Congestive heart failure.  3.  Obesity.  4.  Hypertension.   DISCHARGE MEDICATIONS:  Lasix 40 mg 2 daily, K-Dur 10 mEq two b.i.d., Nexium  40 mg one daily, hydrocortisone cream apply twice daily, Coreg 3.125 mg  daily, Actos 5 mg daily, Keflex 500 mg t.i.d., Xanax 0.25 mg daily, aspirin  81 mg daily, Benadryl 25 mg q.i.d. as needed, CPAP machine to be applied at  night as directed.   DISCHARGE INSTRUCTIONS:  Activities as tolerated.  Diet 1500 mL fluid  restriction, low fat, low salt diet.  Follow up with Dr. Orpah Cobb in 1-2  weeks, the patient is to call [phone] for appointment.   CONDITION ON DISCHARGE:  Improved.   HISTORY:  This 76 year old white male presented with a two day history of  left leg redness along with mild fever, pain, and swelling.  The patient  denied any chest pain but admitted to some shortness of breath with  activity.  Past medical history positive for hypertension for five years  without any history of diabetes or elevated cholesterol level.  Positive  history of premature coronary artery disease, sleep apnea, and obesity.   PHYSICAL EXAMINATION:  Pulse 72, respirations 60, blood pressure 130/72,  height 5 feet 8 inches, weight 263 pounds.  The patient is alert and  oriented x 3 in mild distress.  HEENT:  The patient wears glasses, has blue-  green eyes, pupils equal and reactive to light, extraocular movements  intact, normocephalic, atraumatic, mucous membranes moist.  Neck:  4 cm JVD  at 90 degree angle.  There is wax in the left ear.   Lungs clear bilaterally.  Heart:  Normal S1 and S2, grade 2/6 systolic murmur.  Abdomen:  Distended,  soft, with a large ventral hernia.  Extremities:  2-3+ edema, no cyanosis or  clubbing.  The left lower has 6 by 9 inch area of redness with mild  tenderness.  CNS:  Cranial nerves grossly intact, the patient moves all four  extremities.   LABORATORY DATA:  Normal hemoglobin, hematocrit, WBC, platelets.  Normal  electrolytes, BUN, creatinine.  BNP less than 30.  Blood culture no growth  for five days.  Chest x-ray shows cardiomegaly without evidence of acute  cardiopulmonary disease.   HOSPITAL COURSE:  The patient was admitted to the telemetry unit, he was  given IV Lasix, blood cultures were drawn, IV Rocephin was started, and home  medications of Norvasc and aspirin continued.  The patient had  significant improvement in first 48 to 72 hours of hospitalization.  His leg  swelling was markedly improved and the redness and tenderness  of the left  lower extremity was diminishing with antibiotic use.  He was then switched  to oral antibiotics and discharged home in satisfactory condition with  follow up by me in 1-2 weeks.      Ricki Rodriguez, M.D.  Electronically Signed     ASK/MEDQ  D:  08/01/2005  T:  08/01/2005  Job:  [id number]

## 2011-03-24 NOTE — Discharge Summary (Signed)
NAME:  Samuel Garza, Samuel Garza NO.:  [id number]   MEDICAL RECORD NO.:  [id number]          PATIENT TYPE:  INP   LOCATION:  4729                         FACILITY:  [facility]   PHYSICIAN:  Ricki Rodriguez, M.D.  DATE OF BIRTH:  February 01, 1934   DATE OF ADMISSION:  07/09/2007  DATE OF DISCHARGE:  07/12/2007                               DISCHARGE SUMMARY   FINAL DIAGNOSES:  1. Acute pancreatitis.  2. Cholelithiasis.  3. Lower extremity edema.  4. Morbid obesity.  5. Hypertension.  6. Coronary atherosclerosis of native coronary vessel.   DISCHARGE MEDICATIONS:  1. Altace 5 mg one daily.  2. Coreg 3.125 mg one twice daily.  3. Prilosec 20 mg or Prevacid 30 mg one daily.  4. Aspirin 81 mg one daily.  5. Antivert 12.5 mg three times daily as needed for dizziness.  6. Lasix 40 mg one daily.  7. Kay Ciel 10 mEq two daily.  8. Cipro 500 mg twice daily for 5 days,  9. Albuterol metered-dose inhaler, 2 puffs 4 times daily as needed for      shortness of breath.   DISCHARGE DIET:  Low-sodium, heart-healthy diet after tolerating soft  diet with 1500-mL fluid restriction.   DISCHARGE ACTIVITY:  The patient is to increase activity slowly.   FOLLOWUP:  By Dr. Orpah Cobb in 1 month, patient to call [phone] for  appointment.   HISTORY:  This 77 year old white male presented with shortness of breath  and leg edema; he also complained of some abdominal discomfort.  His  past medical history was positive for hypertension for 7 years and  obesity.   PHYSICAL EXAMINATION:  The patient is a well-built, well-nourished white  male in mild respiratory distress.  Pulse 65, respirations 18, blood  pressure 130/60, height 5 feet 8 inch, weight 290 pounds.  HEENT:  The patient is normocephalic, atraumatic with blue-green eyes,  wears glasses.  Pupils are equal and reactive to light.  Extraocular  movement intact.  Mucous membranes pink and moist.  NECK:  No JVD, no carotid bruit.  LUNGS:   Decreased air entry at both lower lobes.  HEART:  Normal S1 and S2 with grade 2/6 systolic murmur.  ABDOMEN:  Distended, soft, with a large ventral hernia.  EXTREMITIES:  Edema 2+, no cyanosis or clubbing.  CNS: Cranial nerves grossly intact.  The patient moves all 4  extremities.   LABORATORY DATA:  Normal hemoglobin and hematocrit, WBC count and  platelet count.  Normal PT/INR.  Normal electrolytes, BUN and  creatinine.  Glucose borderline at 100; subsequent glucose ranged from  111-134 mg/dL.  Cardiac enzymes negative x1.  Liver enzymes normal.  Amylase and lipase normal and blood culture with no growth.   EKG showed normal sinus rhythm with low-voltage QRS.  Echocardiogram  showed normal LV systolic function with mild diastolic dysfunction and  calcific aortic valve with suggestion of bicuspid aortic valve.   CT of the abdomen and pelvis showed only cholelithiasis without  cholecystitis, old granulomatous disease in the lung, low-density lesion  in the inferior renal fold and mild pancreatitis.  HOSPITAL COURSE:  The patient was admitted to the telemetry unit.  He  was given IV Rocephin and IV fluids and he underwent CT of the abdomen  and pelvis for ongoing abdominal pain; this showed early pancreatitis  and cholelithiasis without cholecystitis.  The patient's condition  gradually improved over 72 hours of IV fluids. Then clear liquids were  started.  His abdominal pain resolved for the most part and he was able  to ambulate without any difficulty; hence, he was discharged home in  satisfactory condition with followup by me in 2 weeks.      Ricki Rodriguez, M.D.  Electronically Signed     ASK/MEDQ  D:  09/12/2007  T:  09/13/2007  Job:  [id number]

## 2011-03-24 NOTE — Op Note (Signed)
   NAME:  Samuel Garza, Samuel Garza                         ACCOUNT NO.:  [id number]   MEDICAL RECORD NO.:  [id number]                   PATIENT TYPE:  AMB   LOCATION:  DSC                                  FACILITY:  [facility]   PHYSICIAN:  John L. Rendall III, M.D.           DATE OF BIRTH:  [birth year]   DATE OF PROCEDURE:  07/29/2003  DATE OF DISCHARGE:                                 OPERATIVE REPORT   PREOPERATIVE DIAGNOSIS:  Degenerative medial meniscus with osteoarthritis.   SURGICAL PROCEDURE:  Arthroscopic medial and lateral meniscectomy with  removal of cartilaginous loose bodies.   POSTOPERATIVE DIAGNOSIS:  Degenerative tears, medial and lateral menisci,  with osteoarthritis and hyaline cartilage loose bodies.   SURGEON:  John L. Rendall, M.D.   ANESTHESIA:  MAC.   INDICATION AND JUSTIFICATION FOR PROCEDURE:  Nine months of pain in the  right knee with primarily medial joint swelling, crepitation, and grinding.   JUSTIFICATION FOR OUTPATIENT CENTER:  Inpatient not required.   DESCRIPTION OF PROCEDURE:  Under MAC anesthesia, the right knee is prepared  with Betadine and draped as a sterile field with a leg holder.  Standard  arthroscopic portals are made and findings of a full-thickness crater in the  medial femoral condyle were seen with peeling hyaline cartilage around it.  There is inflammation from chronic creaking in this area.  There was a  posterior horn flap tear of the medial meniscus that may have caused this  crater.  Using a combination of basket forceps and shaver, the posterior  horn of the medial meniscus was resected.  Chondroplasty of the medial  femoral condyle was done. A radial tear of the lateral meniscus was found  with local degeneration of the lateral femoral condyle.  This area was  debrided.  The area of full-thickness cartilage loss in the medial femoral  condyle was about 1 x 1.5 cm.  Cartilaginous loose bodies were found  floating in the suprapatellar  pouch and great care was taken to get these  out of the knee using a combination of irrigation cannulas to flush them and  a shaver to chew them up and suck them out.  Once these were all debrided,  the knee was copiously irrigated with saline, infiltrated with Marcaine with  morphine with epinephrine, and a sterile compression bandage applied.  The  patient returned to recovery in good condition.  He was given Vicodin for  pain, recheck in the office next Tuesday.                                               John L. Dorothyann Gibbs, M.D.    Renato Gails  D:  07/29/2003  T:  07/29/2003  Job:  [id number]

## 2011-03-24 NOTE — Discharge Summary (Signed)
NAME:  Samuel Garza, Samuel Garza NO.:  [id number]   MEDICAL RECORD NO.:  [id number]          PATIENT TYPE:  INP   LOCATION:  3711                         FACILITY:  [facility]   PHYSICIAN:  Ricki Rodriguez, M.D.  DATE OF BIRTH:  02-22-1934   DATE OF ADMISSION:  06/25/2006  DATE OF DISCHARGE:  06/26/2006                                 DISCHARGE SUMMARY   PRINCIPAL DIAGNOSES:  1. Positional vertigo.  2. Hypertension.  3. History of chest pain and coronary disease.  4. Obesity.   DISCHARGE MEDICATIONS:  1. Altace 5 mg 1 daily.  2. Coreg 3.125 mg twice daily.  3. Antivert 12.5 mg three times daily.  4. Aspirin 81 mg 1 daily after 3 days.  5. Prilosec over-the-counter 20 mg 1 daily.  6. Patient to avoid sudden change in position, keep cane handy when going      for a walk.   DISCHARGE DIET:  Low-fat, low-salt, 1800-calorie diet.   FOLLOWUP APPOINTMENTS:  Followup by Dr. Orpah Cobb in 1 or 2 weeks.  The  patient to call [phone] for appointment.   SPECIAL INSTRUCTIONS:  The patient was strongly advised to decrease food by  50% to decrease weight.   HISTORY OF PRESENT ILLNESS:  This 77 year old white male was admitted after  feeling dizzy at work.  The patient drove home and got sick to his stomach  with nausea and vomiting.  The patient's wife thought he was white as a  ghost.  The patient also noticed some vertigo with change in position.  Also  in lying down in the bed.  The patient had seen an ENT doctor last week, who  cleaned out his ear and prescribed Cortisporin ear drops and he was  apparently feeling better prior to this episode.   PHYSICAL EXAMINATION:  GENERAL:  The patient is a well-built, well-nourished  white male in mild distress.  VITAL SIGNS:  Pulse 70.  Respirations 15.  Blood pressure 130/60.  Height 5  feet 8 inches.  Weight approximately 270 pounds.  GENERAL:  The patient is alert and oriented x3.  HEENT:  The patient has blue-green eyes.  Wears  glasses.  Pupils are equal  and reactive to light.  Extraocular movements intact.  The patient is  normocephalic, atraumatic, with his membranes pink and moist.  NECK:  No JVD.  No carotid bruits.  LUNGS:  Clear bilaterally.  HEART:  Normal S1 and S2 with a grade 2/6 systolic murmur.  ABDOMEN:  Soft and distended, but nontender.  The patient has a large  ventral hernia.  EXTREMITIES:  Edema 2+, no cyanosis or clubbing.  CNS:  Cranial nerves grossly intact.  The patient moves all 4 extremities.   LABORATORY DATA:  Normal hemoglobin and hematocrit, WBC count, platelet  count; normal electrolytes, BUN and creatinine, sugar borderline at 146.   EKG normal sinus rhythm with low voltage QRS.  Chest x-ray:  No acute  cardiopulmonary process.  X-ray of the abdomen revealed no evidence of bowel  perforation or obstruction.  CT of the head revealed no acute intracranial  abnormality.   HOSPITAL COURSE:  The patient was admitted to telemetry unit.  He did not  have any rhythm disturbance.  His condition improved with the use of the  Antivert and IV fluids, and he was discharged home in satisfactory condition  on June 26, 2006, with followup by me in 2 weeks.      Ricki Rodriguez, M.D.  Electronically Signed     ASK/MEDQ  D:  09/06/2006  T:  09/07/2006  Job:  [id number]

## 2011-08-18 LAB — COMPREHENSIVE METABOLIC PANEL
AST: 18
Albumin: 3.6
Alkaline Phosphatase: 47
BUN: 12
CO2: 27
Chloride: 103
Creatinine, Ser: 0.97
GFR calc non Af Amer: >60
Potassium: 4
Total Bilirubin: 0.8

## 2011-08-18 LAB — BASIC METABOLIC PANEL
BUN: 10
BUN: 14
BUN: 17
CO2: 28
CO2: 30
Calcium: 8.9
Creatinine, Ser: 1.06
Creatinine, Ser: 1.07
GFR calc Af Amer: >60
GFR calc non Af Amer: >60
Glucose, Bld: 123 — ABNORMAL HIGH
Glucose, Bld: 134 — ABNORMAL HIGH
Potassium: 3.6
Potassium: 3.9
Sodium: 134 — ABNORMAL LOW

## 2011-08-18 LAB — CBC
HCT: 41.7
Hemoglobin: 14.4
MCHC: 33.6
MCV: 85.9
Platelets: 226
Platelets: 234
RBC: 4.85
RDW: 13.2
WBC: 10.5

## 2011-08-18 LAB — DIFFERENTIAL
Basophils Absolute: 0.1
Basophils Absolute: 0.1
Basophils Relative: 1
Basophils Relative: 1
Eosinophils Relative: 3
Lymphocytes Relative: 22
Monocytes Absolute: 1.4 — ABNORMAL HIGH
Monocytes Absolute: 1.5 — ABNORMAL HIGH
Neutro Abs: 6.4
Neutro Abs: 6.5
Neutrophils Relative %: 60

## 2011-08-18 LAB — PROTIME-INR: Prothrombin Time: 13.4

## 2011-08-18 LAB — CULTURE, BLOOD (ROUTINE X 2)
Culture: NO GROWTH
Culture: NO GROWTH

## 2011-08-18 LAB — CARDIAC PANEL(CRET KIN+CKTOT+MB+TROPI): Relative Index: 1.2

## 2011-09-27 ENCOUNTER — Observation Stay (HOSPITAL_COMMUNITY)
Admission: AD | Admit: 2011-09-27 | Discharge: 2011-09-28 | Disposition: A | Discharge status: Home / Self Care | Payer: Medicare Other | Source: Ambulatory Visit | Attending: Cardiovascular Disease | Admitting: Cardiovascular Disease

## 2011-09-27 ENCOUNTER — Encounter (HOSPITAL_COMMUNITY): Payer: Self-pay | Admitting: Cardiovascular Disease

## 2011-09-27 ENCOUNTER — Inpatient Hospital Stay (HOSPITAL_COMMUNITY): Payer: Medicare Other

## 2011-09-27 ENCOUNTER — Other Ambulatory Visit: Payer: Self-pay

## 2011-09-27 DIAGNOSIS — R079 Chest pain, unspecified: Principal | ICD-10-CM | POA: Diagnosis present

## 2011-09-27 DIAGNOSIS — E66813 Obesity, class 3: Secondary | ICD-10-CM

## 2011-09-27 DIAGNOSIS — Z6841 Body Mass Index (BMI) 40.0 and over, adult: Secondary | ICD-10-CM | POA: Insufficient documentation

## 2011-09-27 DIAGNOSIS — I10 Essential (primary) hypertension: Secondary | ICD-10-CM | POA: Insufficient documentation

## 2011-09-27 DIAGNOSIS — G4733 Obstructive sleep apnea (adult) (pediatric): Secondary | ICD-10-CM | POA: Diagnosis present

## 2011-09-27 DIAGNOSIS — Z8673 Personal history of transient ischemic attack (TIA), and cerebral infarction without residual deficits: Secondary | ICD-10-CM | POA: Insufficient documentation

## 2011-09-27 DIAGNOSIS — G4730 Sleep apnea, unspecified: Secondary | ICD-10-CM | POA: Diagnosis present

## 2011-09-27 HISTORY — DX: Chest pain, unspecified: R07.9

## 2011-09-27 HISTORY — DX: Cerebral infarction, unspecified: I63.9

## 2011-09-27 HISTORY — DX: Shortness of breath: R06.02

## 2011-09-27 HISTORY — DX: Morbid (severe) obesity due to excess calories: E66.01

## 2011-09-27 HISTORY — DX: Angina pectoris, unspecified: I20.9

## 2011-09-27 HISTORY — DX: Sleep apnea, unspecified: G47.30

## 2011-09-27 HISTORY — DX: Essential (primary) hypertension: I10

## 2011-09-27 HISTORY — DX: Obesity, class 3: E66.813

## 2011-09-27 LAB — CARDIAC PANEL(CRET KIN+CKTOT+MB+TROPI)
CK, MB: 2.5 ng/mL (ref 0.3–4.0)
CK, MB: 2.3 ng/mL (ref 0.3–4.0)
Total CK: 102 U/L (ref 7–232)
Troponin I: <0.3 ng/mL (ref <0.30)

## 2011-09-27 LAB — CBC
HCT: 42.6 % (ref 39.0–52.0)
Platelets: 256 10*3/uL (ref 150–400)
RBC: 4.94 MIL/uL (ref 4.22–5.81)
RDW: 13.2 % (ref 11.5–15.5)
WBC: 8.2 10*3/uL (ref 4.0–10.5)

## 2011-09-27 LAB — BASIC METABOLIC PANEL
Chloride: 100 mEq/L (ref 96–112)
GFR calc Af Amer: >90 mL/min (ref >90)
Potassium: 4 mEq/L (ref 3.5–5.1)

## 2011-09-27 MED ORDER — ZOLPIDEM TARTRATE 5 MG PO TABS: 5.0000 mg | ORAL_TABLET | Freq: Every evening | ORAL | Status: DC | PRN

## 2011-09-27 MED ORDER — ALPRAZOLAM 0.25 MG PO TABS: 0.2500 mg | ORAL_TABLET | Freq: Two times a day (BID) | ORAL | Status: DC | PRN

## 2011-09-27 MED ORDER — POTASSIUM CHLORIDE CRYS ER 20 MEQ PO TBCR
20.0000 meq | EXTENDED_RELEASE_TABLET | Freq: Every day | ORAL | Status: DC
  Administered 2011-09-27: 20 meq via ORAL
  Filled 2011-09-27 (×2): qty 1

## 2011-09-27 MED ORDER — NITROGLYCERIN 0.4 MG SL SUBL
0.4000 mg | SUBLINGUAL_TABLET | SUBLINGUAL | Status: DC | PRN
Start: 2011-09-27 — End: 2011-09-28

## 2011-09-27 MED ORDER — SODIUM CHLORIDE 0.9 % IJ SOLN
3.0000 mL | Freq: Two times a day (BID) | INTRAMUSCULAR | Status: DC
  Administered 2011-09-27 (×2): 3 mL via INTRAVENOUS

## 2011-09-27 MED ORDER — SODIUM CHLORIDE 0.9 % IV SOLN
250.0000 mL | INTRAVENOUS | Status: DC
  Administered 2011-09-27: 250 mL via INTRAVENOUS

## 2011-09-27 MED ORDER — PANTOPRAZOLE SODIUM 40 MG IV SOLR
40.0000 mg | INTRAVENOUS | Status: DC
  Administered 2011-09-27: 40 mg via INTRAVENOUS
  Filled 2011-09-27 (×2): qty 40

## 2011-09-27 MED ORDER — FUROSEMIDE 40 MG PO TABS
40.0000 mg | ORAL_TABLET | Freq: Two times a day (BID) | ORAL | Status: DC
  Administered 2011-09-27 – 2011-09-28 (×2): 40 mg via ORAL
  Filled 2011-09-27 (×4): qty 1

## 2011-09-27 MED ORDER — ACETAMINOPHEN 325 MG PO TABS: 650.0000 mg | ORAL_TABLET | ORAL | Status: DC | PRN

## 2011-09-27 MED ORDER — HEPARIN (PORCINE) IN NACL 100-0.45 UNIT/ML-% IJ SOLN
1450.0000 [IU]/h | INTRAMUSCULAR | Status: DC
  Administered 2011-09-27: 1150 [IU]/h via INTRAVENOUS
  Filled 2011-09-27 (×3): qty 250

## 2011-09-27 MED ORDER — SODIUM CHLORIDE 0.9 % IJ SOLN: 3.0000 mL | INTRAMUSCULAR | Status: DC | PRN

## 2011-09-27 MED ORDER — ROSUVASTATIN CALCIUM 10 MG PO TABS
10.0000 mg | ORAL_TABLET | Freq: Every day | ORAL | Status: DC
  Administered 2011-09-27 (×2): 10 mg via ORAL
  Filled 2011-09-27 (×2): qty 1

## 2011-09-27 MED ORDER — ONDANSETRON HCL 4 MG/2ML IJ SOLN: 4.0000 mg | Freq: Four times a day (QID) | INTRAMUSCULAR | Status: DC | PRN

## 2011-09-27 MED ORDER — HEPARIN BOLUS VIA INFUSION
4000.0000 [IU] | Freq: Once | INTRAVENOUS | Status: AC
  Administered 2011-09-27: 4000 [IU] via INTRAVENOUS
  Filled 2011-09-27: qty 4000

## 2011-09-27 MED ORDER — RAMIPRIL 5 MG PO CAPS
5.0000 mg | ORAL_CAPSULE | Freq: Every day | ORAL | Status: DC
  Filled 2011-09-27: qty 1

## 2011-09-27 MED ORDER — HEPARIN BOLUS VIA INFUSION
3000.0000 [IU] | Freq: Once | INTRAVENOUS | Status: AC
  Administered 2011-09-27: 3000 [IU] via INTRAVENOUS
  Filled 2011-09-27: qty 3000

## 2011-09-27 MED ORDER — CARVEDILOL 3.125 MG PO TABS
3.1250 mg | ORAL_TABLET | Freq: Two times a day (BID) | ORAL | Status: DC
  Administered 2011-09-27 – 2011-09-28 (×2): 3.125 mg via ORAL
  Filled 2011-09-27 (×4): qty 1

## 2011-09-27 MED ORDER — CLOPIDOGREL BISULFATE 75 MG PO TABS
75.0000 mg | ORAL_TABLET | Freq: Once | ORAL | Status: AC
  Administered 2011-09-27: 75 mg via ORAL
  Filled 2011-09-27: qty 1

## 2011-09-27 MED ORDER — CLOPIDOGREL BISULFATE 75 MG PO TABS: 75.0000 mg | ORAL_TABLET | Freq: Every day | ORAL | Status: DC

## 2011-09-27 MED ORDER — ASPIRIN EC 81 MG PO TBEC
81.0000 mg | DELAYED_RELEASE_TABLET | Freq: Every day | ORAL | Status: DC
  Filled 2011-09-27: qty 1

## 2011-09-27 NOTE — H&P (Signed)
Samuel Garza is an 77 y.o. male.   Chief Complaint: chest pain  HPI: 77 years old white male with substernal chest pain on Friday and Saturday. Took NTG sublingual tablets x 2 on Friday with eventual relief. Now has weakness for 3 days.  Past Medical History  Diagnosis Date  . Hypertension   . Obesities, morbid   . Chest pain at rest   . Sleep apnea   . Stroke       Past Surgical History  Procedure Date  . Appendectomy 1947    age 42  . Cardiac catheterization   . Left knee arthroscopy 05/2009    age 54  . Gallbladder surgery 12/2010    age 68    Family History  Problem Relation Age of Onset  . Coronary artery disease Father   . Stroke Father    Social History:  reports that he has never smoked. He does not have any smokeless tobacco history on file. He reports that he does not drink alcohol or use illicit drugs.  Allergies: Allergies not on file  Medications Prior to Admission  Medication Dose Route Frequency Provider Last Rate Last Dose  . 0.9 %  sodium chloride infusion  250 mL Intravenous Continuous Ricki Rodriguez, MD      . acetaminophen (TYLENOL) tablet 650 mg  650 mg Oral Q4H PRN Ricki Rodriguez, MD      . ALPRAZolam Prudy Feeler) tablet 0.25 mg  0.25 mg Oral BID PRN Ricki Rodriguez, MD      . aspirin EC tablet 81 mg  81 mg Oral Daily Ricki Rodriguez, MD      . carvedilol (COREG) tablet 3.125 mg  3.125 mg Oral BID WC Ricki Rodriguez, MD      . clopidogrel (PLAVIX) tablet 75 mg  75 mg Oral Once Ricki Rodriguez, MD      . clopidogrel (PLAVIX) tablet 75 mg  75 mg Oral Q breakfast Ricki Rodriguez, MD      . nitroGLYCERIN (NITROSTAT) SL tablet 0.4 mg  0.4 mg Sublingual Q5 min PRN Ricki Rodriguez, MD      . ondansetron (ZOFRAN) injection 4 mg  4 mg Intravenous Q6H PRN Ricki Rodriguez, MD      . pantoprazole (PROTONIX) injection 40 mg  40 mg Intravenous Q24H Ricki Rodriguez, MD      . rosuvastatin (CRESTOR) tablet 10 mg  10 mg Oral Daily Ricki Rodriguez, MD      . sodium chloride 0.9  % injection 3 mL  3 mL Intravenous Q12H Ricki Rodriguez, MD      . sodium chloride 0.9 % injection 3 mL  3 mL Intravenous PRN Ricki Rodriguez, MD      . zolpidem (AMBIEN) tablet 5 mg  5 mg Oral QHS PRN Ricki Rodriguez, MD       No current outpatient prescriptions on file as of 09/27/2011.    No results found for this or any previous visit (from the past 48 hour(s)). No results found.  @ROS @  Blood pressure 105/62, pulse 63, temperature 97.9 F (36.6 C), resp. rate 18, SpO2 97.00%. General appearance: alert, cooperative and appears stated age and obese. Head: Normocephalic, without obvious abnormality, atraumatic Eyes: Blue-green eyes, conjunctivae/corneas clear. PERRL, EOM's intact.  Neck: no adenopathy, no carotid bruit, no JVD, supple, symmetrical, trachea midline and thyroid not enlarged, symmetric, no tenderness/mass/nodules Resp: clear to auscultation bilaterally Cardio: regular rate and rhythm, S1,  S2 normal, no click, rub or gallop. II/VI systolic murmur left sternal border GI: soft, distended, non-tender; bowel sounds normal; no masses,  no organomegaly Extremities: extremities normal, atraumatic, no cyanosis. 2 + edema Neurologic: Alert and oriented X 3, normal strength and tone. Normal coordination and gait  Assessment Chest pain rule out MI Hypertension Morbid Obesity Sleep Apnea  Plan: Serial CK/MB/TI. Home medications. Nuclear stress test in AM  [facility] S 09/27/2011, 12:41 PM

## 2011-09-27 NOTE — Progress Notes (Signed)
ANTICOAGULATION CONSULT NOTE - Follow Up Consult  Pharmacy Consult for heparin Indication: r/o ACS  Allergies not on file  Patient Measurements: Ht=5' 8'' Wt= 123.9kg Heparin dosing weight= 97kg  Vital Signs: Temp: 97.9 F (36.6 C) (11/21 1158) BP: 105/62 mmHg (11/21 1158) Pulse Rate: 63  (11/21 1158)  Labs: -pending  PMH -HTN -Obesity -OSA  Medications:  No prescriptions prior to admission   Inpt Medications:  Scheduled:    . aspirin EC  81 mg Oral Daily  . carvedilol  3.125 mg Oral BID WC  . clopidogrel  75 mg Oral Once  . clopidogrel  75 mg Oral Q breakfast  . pantoprazole (PROTONIX) IV  40 mg Intravenous Q24H  . rosuvastatin  10 mg Oral Daily  . sodium chloride  3 mL Intravenous Q12H    Assessment: 77 yo male with CP and to start heparin for r/o ACS.  Goal of Therapy:  Heparin level= 0.3-0.7   Plan:  1) Heparin 4000 unit IV bolus followed by 1150 units/hr (~12 units/kg/hr) 2) Heparin level in 8hrs and daily 3) CBC daily  Krishna, Dancel 09/27/2011,12:48 PM

## 2011-09-27 NOTE — Progress Notes (Signed)
ANTICOAGULATION CONSULT NOTE - Follow Up Consult  Pharmacy Consult for Heparin Indication: r/o ACS  No Known Allergies  Patient Measurements: Height: 5\' 8"  (172.7 cm) Weight: 273 lb 2.4 oz (123.9 kg) IBW/kg (Calculated) : 68.4  Adjusted Body Weight: 97 kg  Vital Signs: Temp: 98.1 F (36.7 C) (11/21 2145) Temp src: Oral (11/21 2145) BP: 107/60 mmHg (11/21 2145) Pulse Rate: 64  (11/21 2145)  Labs:  Basename 09/27/11 2157 09/27/11 1757 09/27/11 1229 09/27/11 1157  HGB -- -- -- 14.6  HCT -- -- -- 42.6  PLT -- -- -- 256  APTT -- -- -- --  LABPROT -- -- -- --  INR -- -- -- --  HEPARINUNFRC 0.10* -- -- --  CREATININE -- -- -- 0.91  CKTOTAL -- 102 121 --  CKMB -- 2.3 2.5 --  TROPONINI -- <0.30 <0.30 --   Estimated Creatinine Clearance: 89.9 ml/min (by C-G formula based on Cr of 0.91).   Assessment: Pt with subtherapeutic heparin level. No issues with gtt or bleeding noted per RN.   Goal of Therapy:  Heparin level 0.3-0.7 units/ml   Plan:  1. Heparin bolus 3000 units IV 2. Increase heparin gtt to 1450 units/hr 3. F/u 8hr heparin level  Lavonia Dana 09/27/2011,11:24 PM

## 2011-09-28 LAB — LIPID PANEL
Cholesterol: 171 mg/dL (ref 0–200)
HDL: 44 mg/dL (ref >39)
Triglycerides: 117 mg/dL (ref <150)
VLDL: 23 mg/dL (ref 0–40)

## 2011-09-28 LAB — CARDIAC PANEL(CRET KIN+CKTOT+MB+TROPI)
CK, MB: 2.4 ng/mL (ref 0.3–4.0)
Total CK: 104 U/L (ref 7–232)

## 2011-09-28 NOTE — Discharge Summary (Signed)
Physician Discharge Summary  Patient ID: Samuel Garza MRN: [id number] DOB/AGE: [birth year] 75 y.o.  Admit date: 09/27/2011 Discharge date: 09/28/2011  Admission Diagnoses: Chest pain at rest Obesity, Class III, BMI 40-49.9 (morbid obesity) Hypertension Sleep apnea   Discharge Diagnoses:  Principal Problem: Chest pain at rest Active Problems:  Obesity, Class III, BMI 40-49.9 (morbid obesity)  Hypertension  Sleep apnea   Discharged Condition: good  Hospital Course: 77 years old white male was admitted with recurrent chest pain. His chest x-ray and blood work was unremarkable. He did not want to wait for the holiday to undergo nuclear stress test. He was discharged home with follow-up in 4-5 days with OP stress test.  Consults: cardiology  Significant Diagnostic Studies: labs: normal CBC, BMET and CK/MB/TI x 3 and radiology: CXR: normal  Treatments: cardiac meds: carvedilol and ramipril and anticoagulation: ASA, Plavix and heparin  Discharge Exam: Blood pressure 128/70, pulse 72, temperature 97.4 F (36.3 C), temperature source Oral, resp. rate 18, height 5\' 8"  (1.727 m), weight 123.197 kg (271 lb 9.6 oz), SpO2 92.00%. General appearance: alert, cooperative and appears stated age Head: Normocephalic, without obvious abnormality, atraumatic Eyes: conjunctivae/corneas clear. PERRL, EOM's intact. Neck: no adenopathy, no carotid bruit, no JVD, supple, symmetrical, trachea midline and thyroid not enlarged, symmetric, no tenderness/mass/nodules Resp: clear to auscultation bilaterally Cardio: regular rate and rhythm, S1, S2 normal, no murmur, click, rub or gallop GI: soft, non-tender; bowel sounds normal; no masses,  no organomegaly Extremities: extremities normal, atraumatic, no cyanosis or edema Skin: Skin color, texture, turgor normal. No rashes or lesions Neurologic: Alert and oriented X 3, normal strength and tone. Normal coordination and gait  Disposition: Home or Self  Care   Current Discharge Medication List    CONTINUE these medications which have NOT CHANGED   Details  aspirin EC 81 MG tablet Take 81 mg by mouth daily.      carvedilol (COREG) 3.125 MG tablet Take 3.125 mg by mouth 2 (two) times daily with a meal.      furosemide (LASIX) 40 MG tablet Take 40 mg by mouth 2 (two) times daily.      meclizine (ANTIVERT) 12.5 MG tablet Take 12.5 mg by mouth 3 (three) times daily as needed. For vertigo     Omeprazole (PRILOSEC PO) Take 1 capsule by mouth daily.      OVER THE COUNTER MEDICATION Take 5 mLs by mouth daily. Mix 1 teaspoonful into 8 oz glass of water as needed for constipation **OTC fiber supplement     potassium chloride (K-DUR) 10 MEQ tablet Take 20 mEq by mouth daily.      ramipril (ALTACE) 5 MG tablet Take 5 mg by mouth daily.         Follow-up Information    Follow up with [facility] S, MD. Call in 4 days. [phone])    Contact information:   [address] [place] [address] [phone]          Signed: Ricki Rodriguez 09/28/2011, 8:12 AM

## 2011-09-28 NOTE — Progress Notes (Signed)
Pt given discharge instructions and questions/concerns answered and addressed. IV discontinued and site unremarkable. Vital signs stable. Will take patient down to car via wheelchair. Will continue to monitor. Debbora Presto 09/28/2011 8:30 AM

## 2011-10-11 ENCOUNTER — Other Ambulatory Visit (HOSPITAL_COMMUNITY): Payer: Self-pay | Admitting: Cardiovascular Disease

## 2011-10-11 DIAGNOSIS — I2510 Atherosclerotic heart disease of native coronary artery without angina pectoris: Secondary | ICD-10-CM

## 2011-10-19 ENCOUNTER — Ambulatory Visit (HOSPITAL_COMMUNITY)
Admission: RE | Admit: 2011-10-19 | Discharge: 2011-10-19 | Disposition: A | Discharge status: Home / Self Care | Payer: Medicare Other | Source: Ambulatory Visit | Attending: Cardiovascular Disease | Admitting: Cardiovascular Disease

## 2011-10-19 ENCOUNTER — Encounter (HOSPITAL_COMMUNITY)
Admission: RE | Admit: 2011-10-19 | Discharge: 2011-10-19 | Disposition: A | Discharge status: Home / Self Care | Payer: Medicare Other | Source: Ambulatory Visit | Attending: Cardiovascular Disease | Admitting: Cardiovascular Disease

## 2011-10-19 DIAGNOSIS — I10 Essential (primary) hypertension: Secondary | ICD-10-CM | POA: Insufficient documentation

## 2011-10-19 DIAGNOSIS — I2510 Atherosclerotic heart disease of native coronary artery without angina pectoris: Secondary | ICD-10-CM

## 2011-10-19 DIAGNOSIS — R0609 Other forms of dyspnea: Secondary | ICD-10-CM | POA: Insufficient documentation

## 2011-10-19 DIAGNOSIS — R0989 Other specified symptoms and signs involving the circulatory and respiratory systems: Secondary | ICD-10-CM | POA: Insufficient documentation

## 2011-10-19 DIAGNOSIS — R079 Chest pain, unspecified: Secondary | ICD-10-CM | POA: Insufficient documentation

## 2011-10-19 MED ORDER — TECHNETIUM TC 99M TETROFOSMIN IV KIT
10.0000 | PACK | Freq: Once | INTRAVENOUS | Status: AC | PRN
  Administered 2011-10-19: 10 via INTRAVENOUS

## 2011-10-19 MED ORDER — REGADENOSON 0.4 MG/5ML IV SOLN
INTRAVENOUS | Status: AC
  Administered 2011-10-19: 0.4 mg via INTRAVENOUS
  Filled 2011-10-19: qty 5

## 2011-10-19 MED ORDER — TECHNETIUM TC 99M TETROFOSMIN IV KIT
30.0000 | PACK | Freq: Once | INTRAVENOUS | Status: AC | PRN
  Administered 2011-10-19: 30 via INTRAVENOUS

## 2013-02-19 ENCOUNTER — Other Ambulatory Visit: Payer: Self-pay | Admitting: Cardiovascular Disease

## 2013-02-19 ENCOUNTER — Ambulatory Visit
Admission: RE | Admit: 2013-02-19 | Discharge: 2013-02-19 | Disposition: A | Discharge status: Home / Self Care | Payer: Medicare Other | Source: Ambulatory Visit | Attending: Cardiovascular Disease | Admitting: Cardiovascular Disease

## 2013-02-19 DIAGNOSIS — R062 Wheezing: Secondary | ICD-10-CM

## 2013-02-19 DIAGNOSIS — R05 Cough: Secondary | ICD-10-CM

## 2013-03-25 ENCOUNTER — Ambulatory Visit
Admission: RE | Admit: 2013-03-25 | Discharge: 2013-03-25 | Disposition: A | Discharge status: Home / Self Care | Payer: Medicare Other | Source: Ambulatory Visit | Attending: Cardiovascular Disease | Admitting: Cardiovascular Disease

## 2013-03-25 ENCOUNTER — Other Ambulatory Visit: Payer: Self-pay | Admitting: Cardiovascular Disease

## 2013-03-25 DIAGNOSIS — R062 Wheezing: Secondary | ICD-10-CM

## 2013-03-25 DIAGNOSIS — R05 Cough: Secondary | ICD-10-CM

## 2013-05-15 IMAGING — CR DG CHEST 2V
2 series · 2 of 2 positions shown · non-contrast
Comparison: 09/27/2011

CLINICAL DATA: Cough, wheezing and shortness of breath.

CHEST - 2 VIEW

[w chest pa]
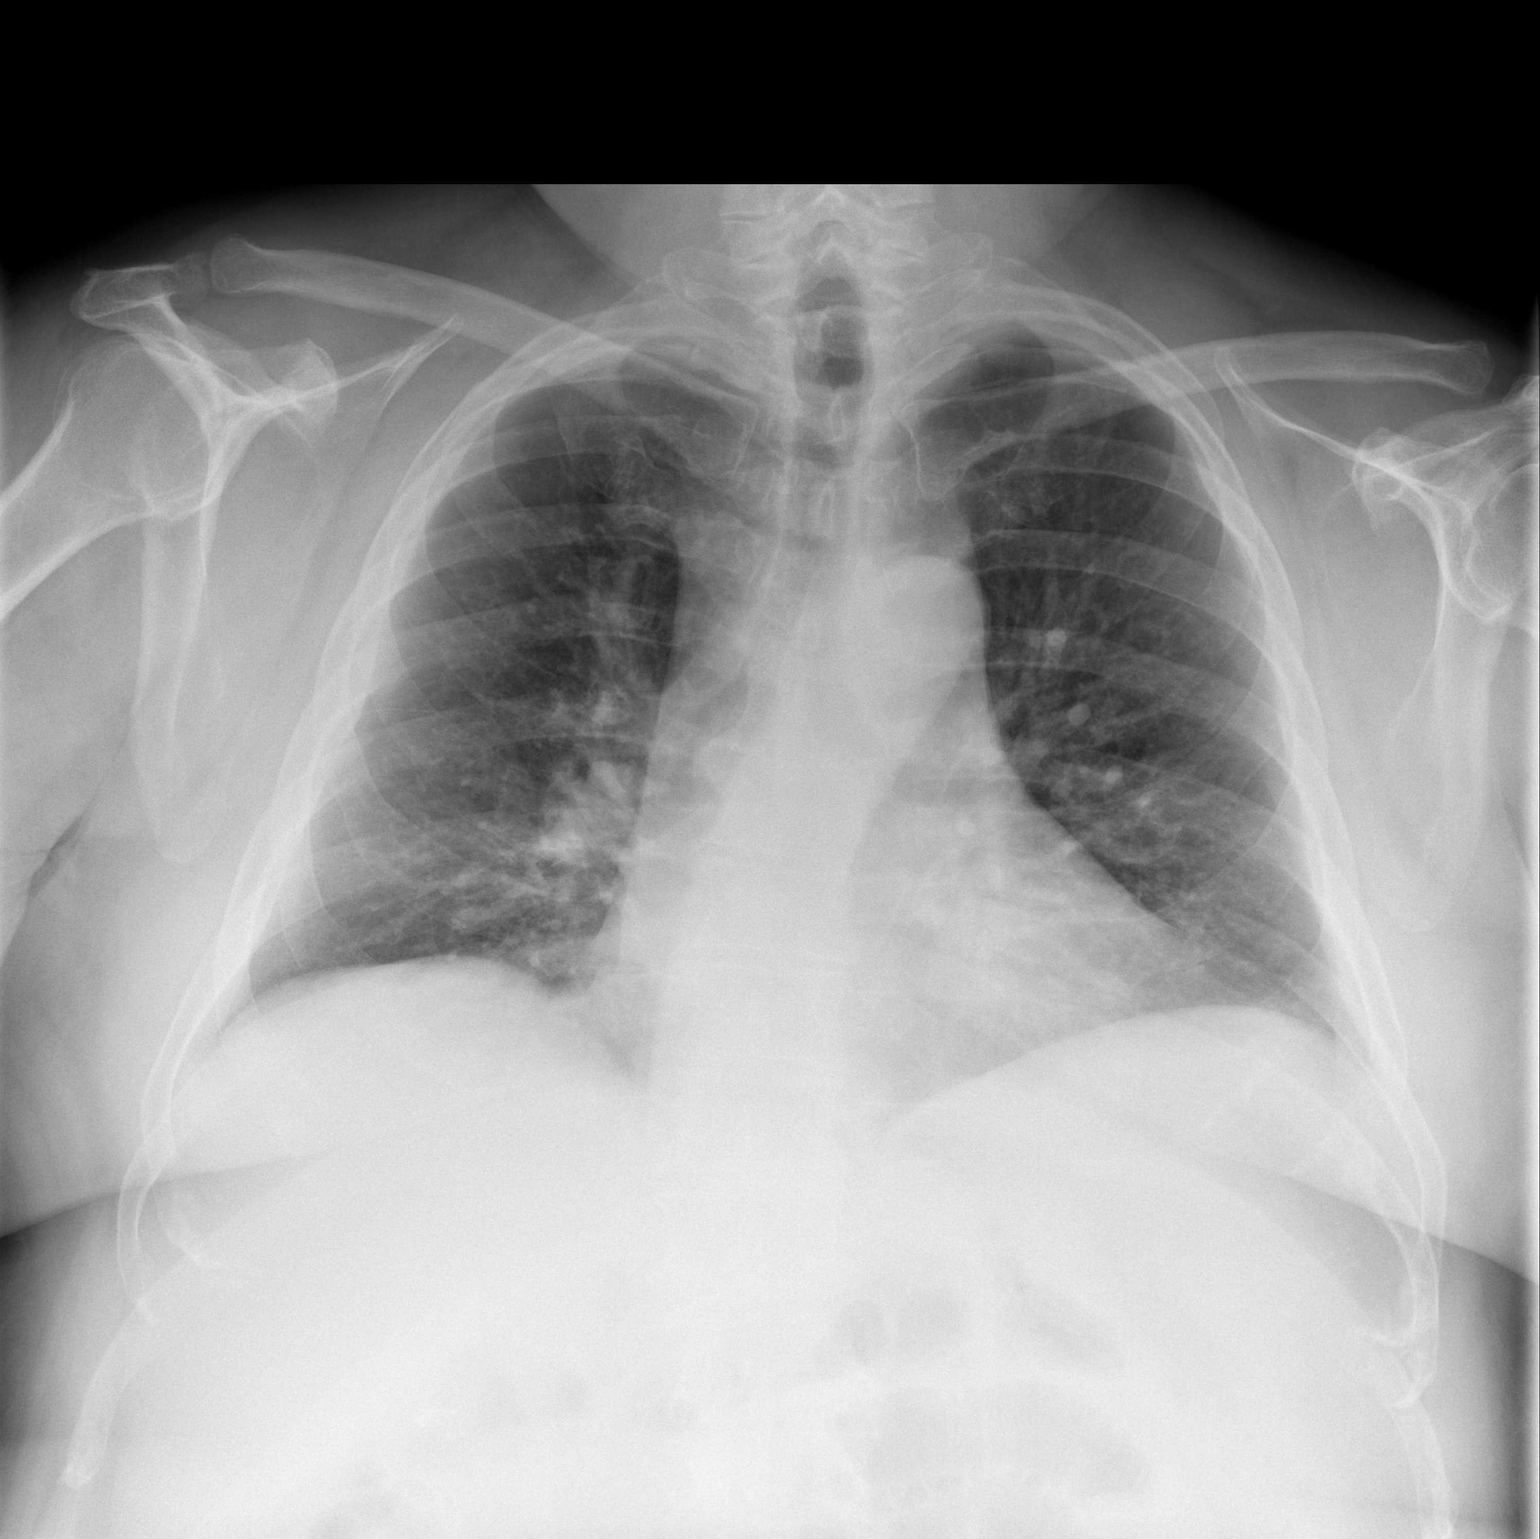

[w chest lat]
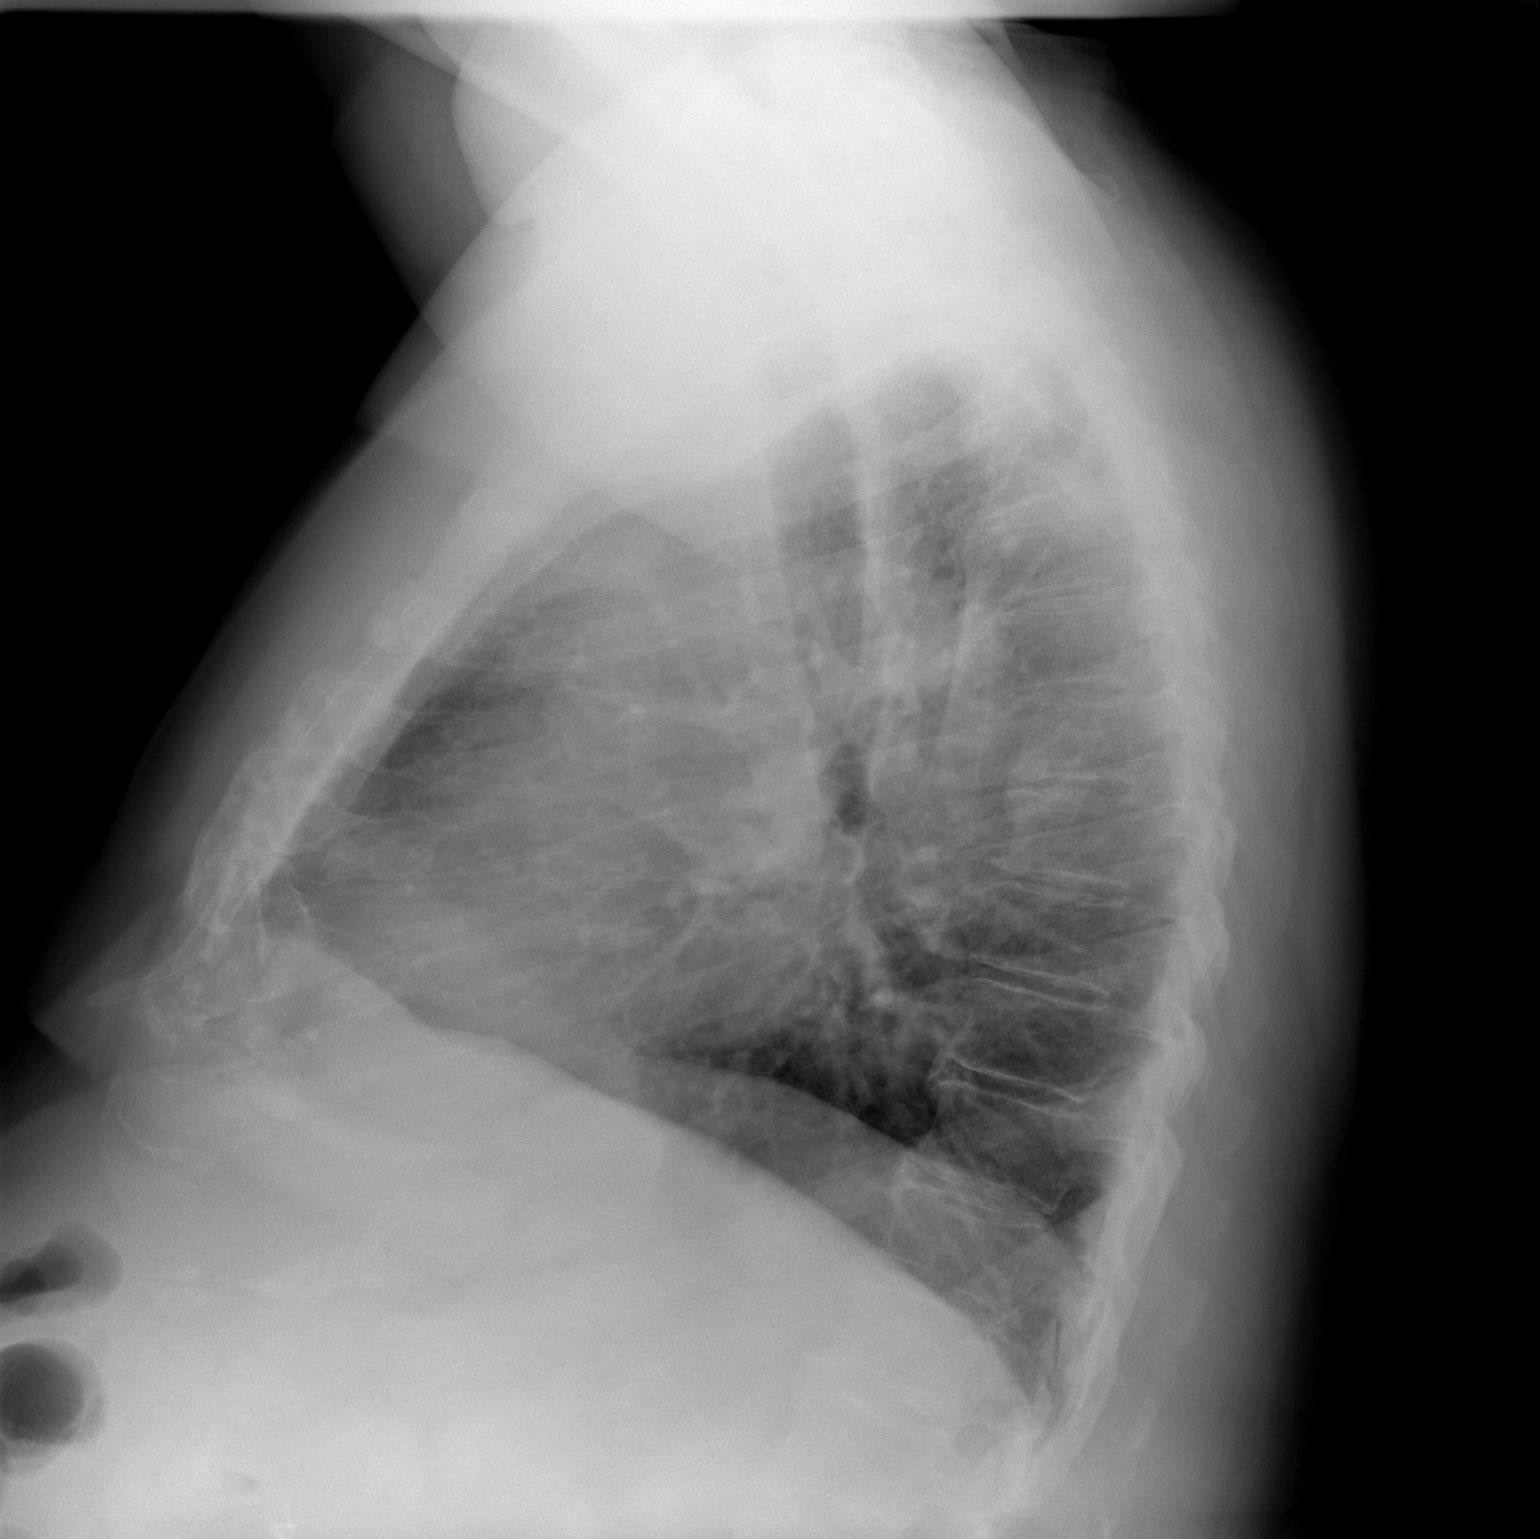

[2 of 2 positions shown; findings below may reference images not displayed]

FINDINGS: Mild bibasilar atelectasis present.  No evidence of
edema, focal infiltrate, nodule or pleural fluid.  The heart size
is stable and within normal limits.  There is stable tortuosity of
the thoracic aorta.  Stable degenerative changes are present in the
thoracic spine.
IMPRESSION: No active disease.  Mild bibasilar atelectasis.

## 2013-06-18 IMAGING — CR DG CHEST 2V
2 series · 2 of 2 positions shown · non-contrast
Comparison: Chest x-ray of 02/19/2013

CLINICAL DATA: Cough, wheezing for 3 days, former smoking history

CHEST - 2 VIEW

[w chest pa]
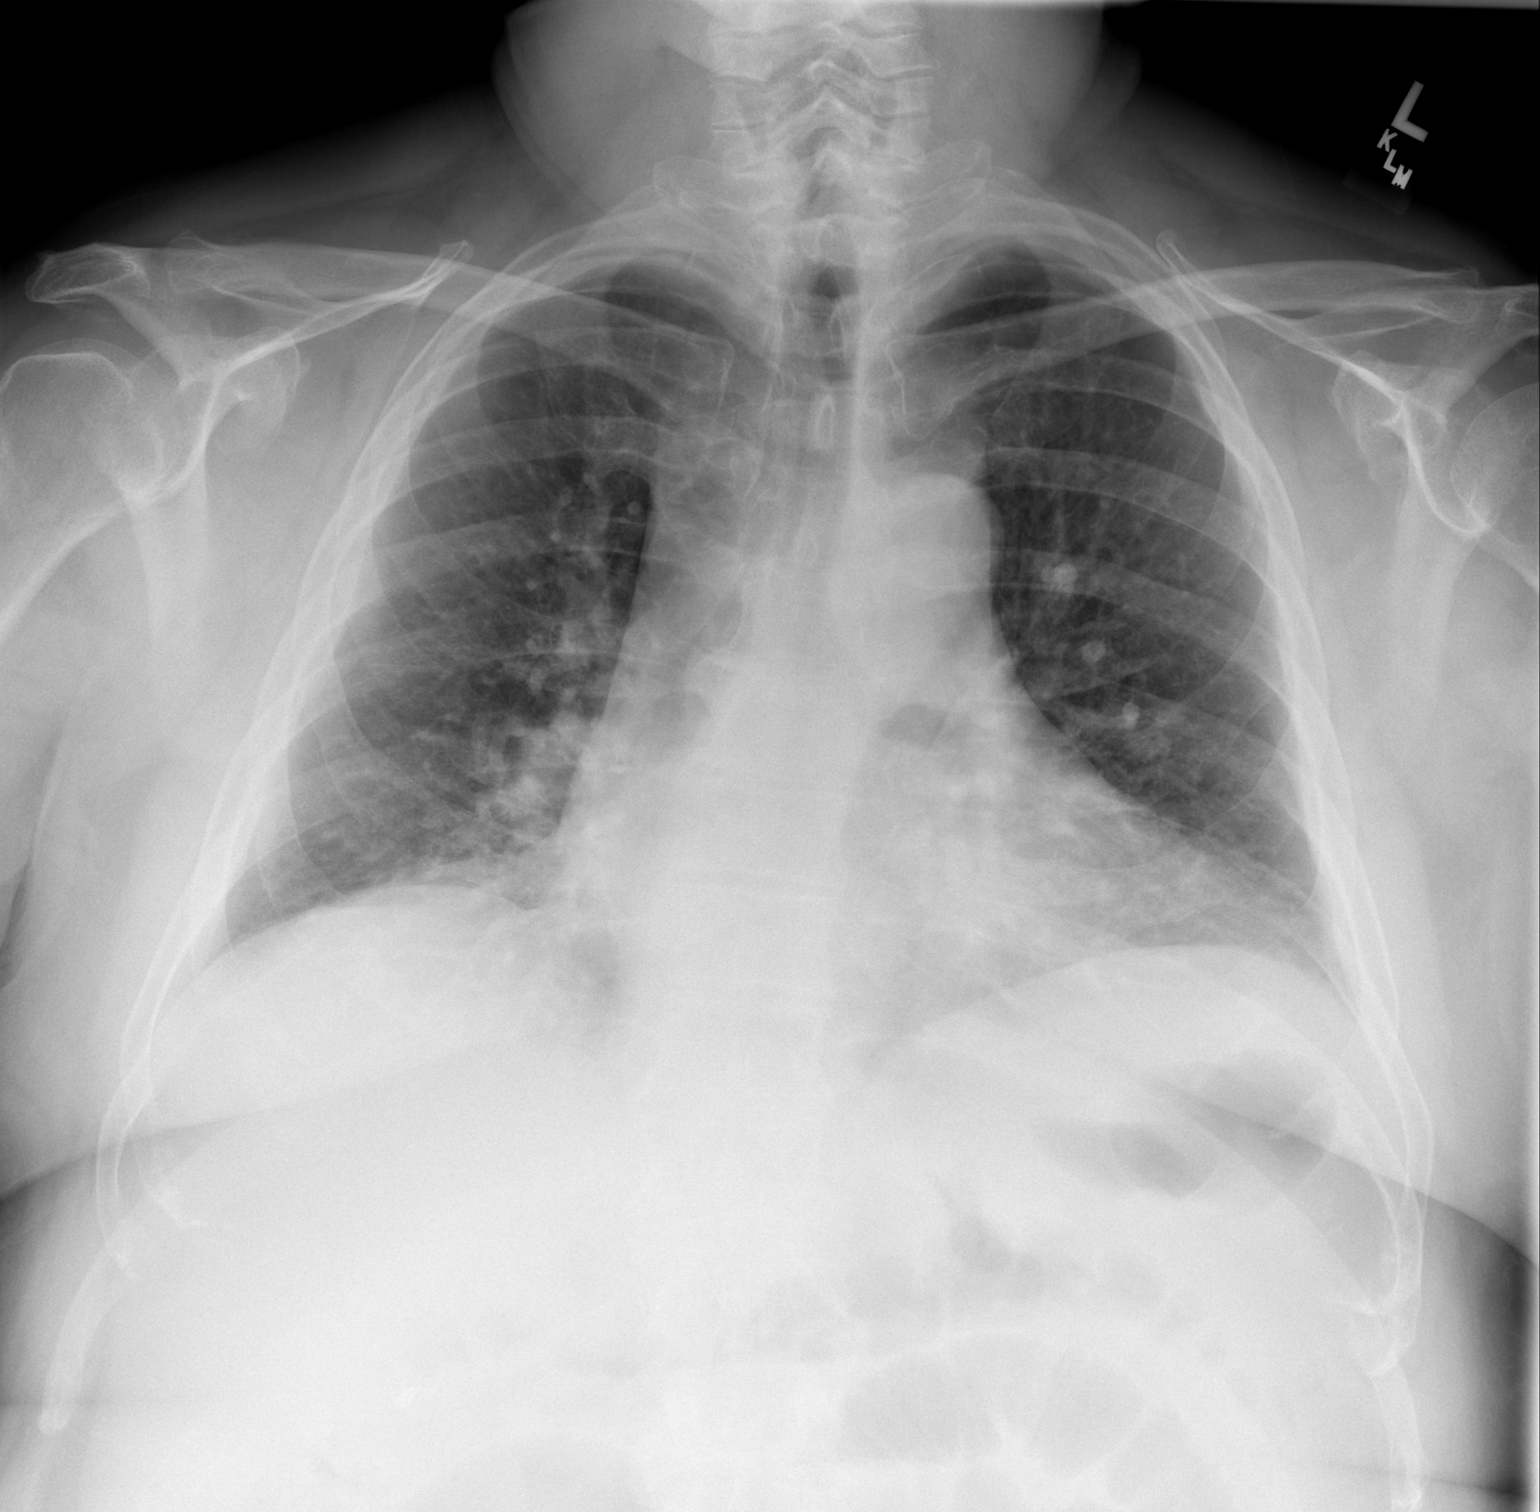

[w chest lat]
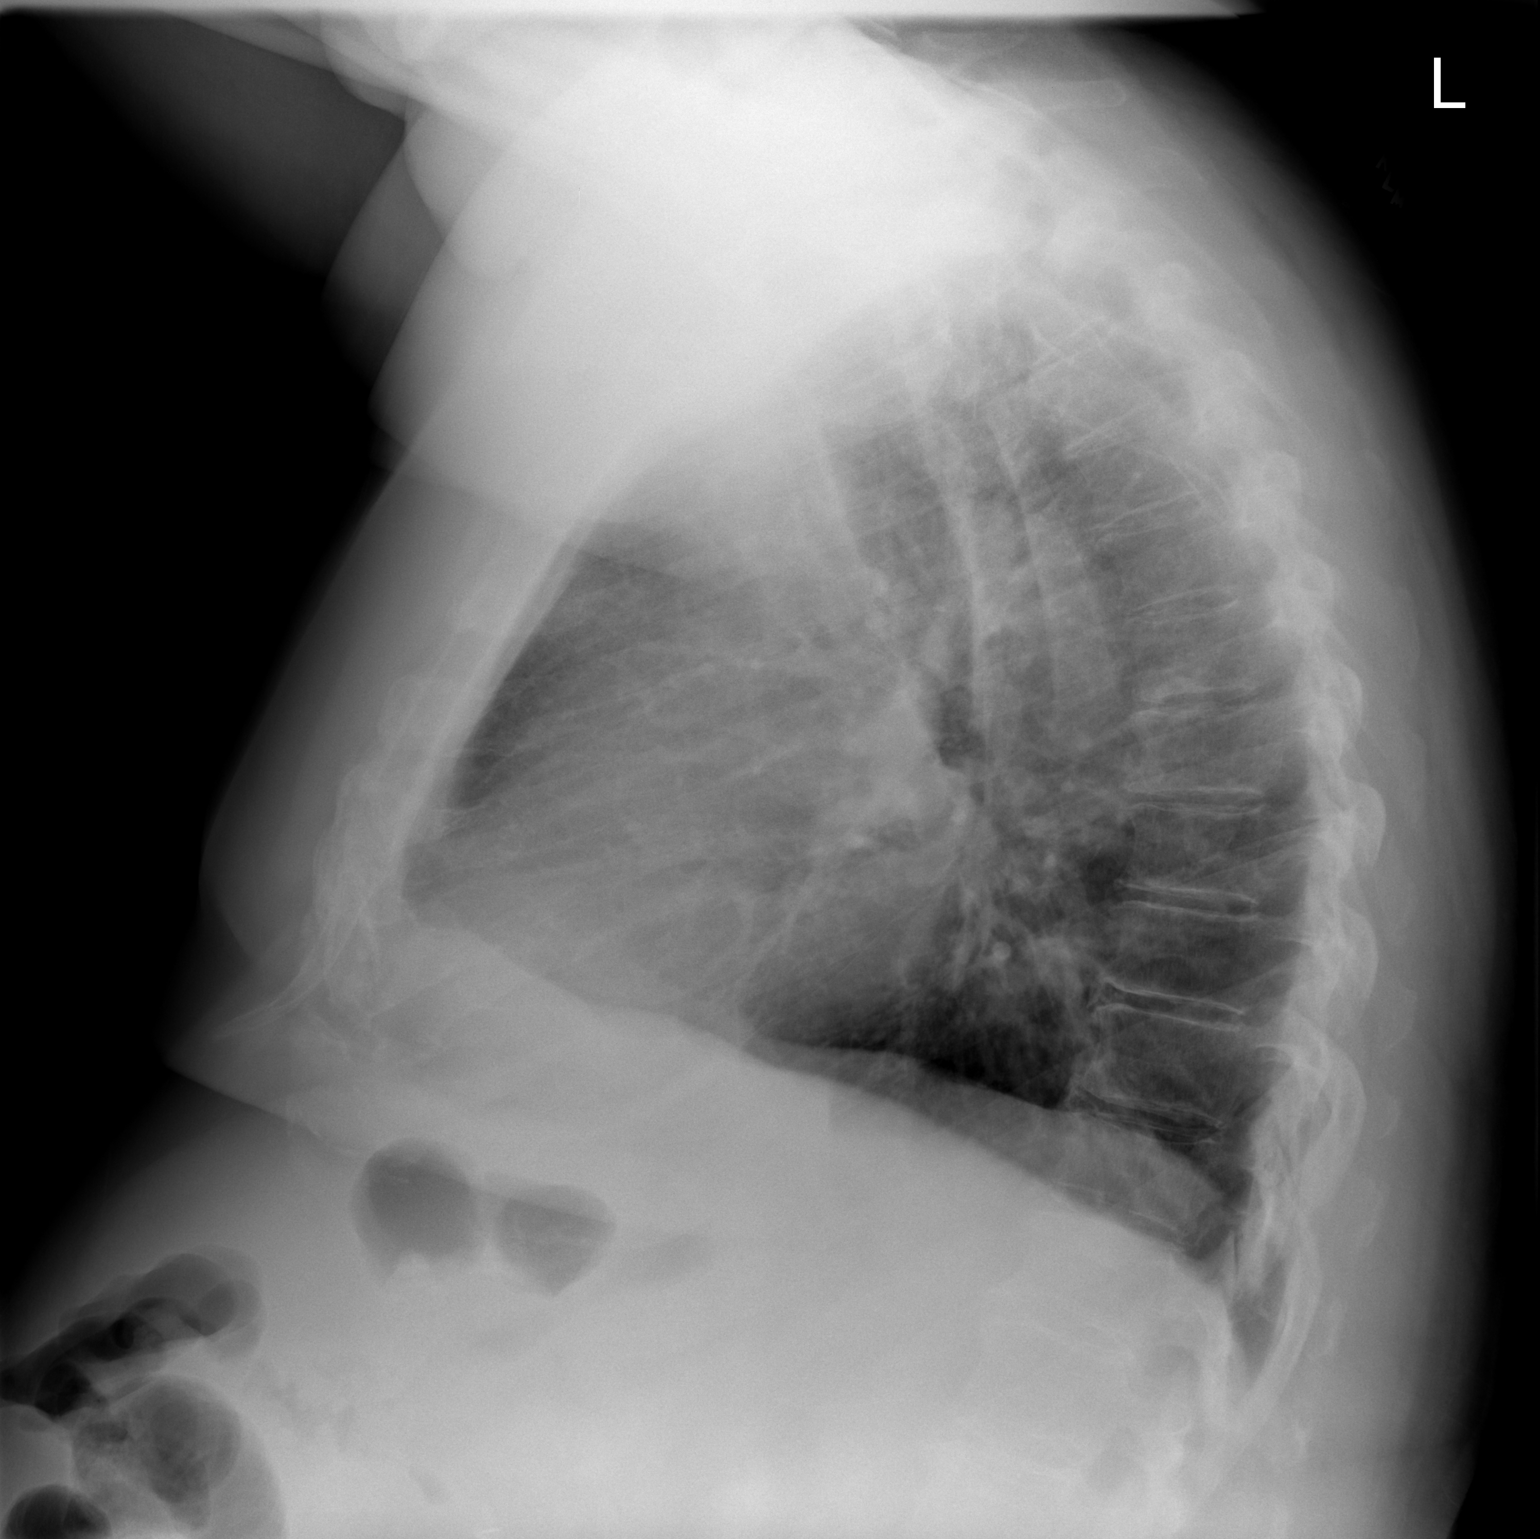

[2 of 2 positions shown; findings below may reference images not displayed]

FINDINGS: No pneumonia is seen.  Slightly prominent perihilar
markings remain which may indicate chronic bronchitis.  The heart
is mildly enlarged and stable.  No bony abnormality is seen.
IMPRESSION: 1.  No pneumonia.
2.  Slightly prominent perihilar markings may indicate bronchitis.
3.  Stable mild cardiomegaly.

## 2015-01-11 DIAGNOSIS — M1711 Unilateral primary osteoarthritis, right knee: Secondary | ICD-10-CM | POA: Diagnosis not present

## 2015-01-11 DIAGNOSIS — M25561 Pain in right knee: Secondary | ICD-10-CM | POA: Insufficient documentation

## 2015-03-17 DIAGNOSIS — D3132 Benign neoplasm of left choroid: Secondary | ICD-10-CM | POA: Diagnosis not present

## 2015-05-01 DIAGNOSIS — R404 Transient alteration of awareness: Secondary | ICD-10-CM | POA: Diagnosis not present

## 2015-05-01 DIAGNOSIS — R112 Nausea with vomiting, unspecified: Secondary | ICD-10-CM | POA: Diagnosis not present

## 2015-05-01 DIAGNOSIS — I517 Cardiomegaly: Secondary | ICD-10-CM | POA: Diagnosis not present

## 2015-05-01 DIAGNOSIS — R531 Weakness: Secondary | ICD-10-CM | POA: Diagnosis not present

## 2015-05-01 DIAGNOSIS — R42 Dizziness and giddiness: Secondary | ICD-10-CM | POA: Diagnosis not present

## 2015-05-19 DIAGNOSIS — H8110 Benign paroxysmal vertigo, unspecified ear: Secondary | ICD-10-CM | POA: Diagnosis not present

## 2015-05-19 DIAGNOSIS — Z1389 Encounter for screening for other disorder: Secondary | ICD-10-CM | POA: Diagnosis not present

## 2015-05-19 DIAGNOSIS — Z9181 History of falling: Secondary | ICD-10-CM | POA: Diagnosis not present

## 2015-05-19 DIAGNOSIS — I10 Essential (primary) hypertension: Secondary | ICD-10-CM | POA: Diagnosis not present

## 2015-07-22 DIAGNOSIS — C44629 Squamous cell carcinoma of skin of left upper limb, including shoulder: Secondary | ICD-10-CM | POA: Diagnosis not present

## 2015-07-30 DIAGNOSIS — H25811 Combined forms of age-related cataract, right eye: Secondary | ICD-10-CM | POA: Diagnosis not present

## 2015-08-11 DIAGNOSIS — C44629 Squamous cell carcinoma of skin of left upper limb, including shoulder: Secondary | ICD-10-CM | POA: Diagnosis not present

## 2015-08-17 DIAGNOSIS — G4730 Sleep apnea, unspecified: Secondary | ICD-10-CM | POA: Diagnosis not present

## 2015-08-17 DIAGNOSIS — I509 Heart failure, unspecified: Secondary | ICD-10-CM | POA: Diagnosis not present

## 2015-08-17 DIAGNOSIS — Z87891 Personal history of nicotine dependence: Secondary | ICD-10-CM | POA: Diagnosis not present

## 2015-08-17 DIAGNOSIS — I10 Essential (primary) hypertension: Secondary | ICD-10-CM | POA: Diagnosis not present

## 2015-08-17 DIAGNOSIS — H25811 Combined forms of age-related cataract, right eye: Secondary | ICD-10-CM | POA: Diagnosis not present

## 2015-08-17 DIAGNOSIS — K219 Gastro-esophageal reflux disease without esophagitis: Secondary | ICD-10-CM | POA: Diagnosis not present

## 2015-09-17 DIAGNOSIS — Z961 Presence of intraocular lens: Secondary | ICD-10-CM | POA: Diagnosis not present

## 2015-10-07 DIAGNOSIS — H35351 Cystoid macular degeneration, right eye: Secondary | ICD-10-CM | POA: Diagnosis not present

## 2015-11-24 DIAGNOSIS — E1165 Type 2 diabetes mellitus with hyperglycemia: Secondary | ICD-10-CM | POA: Diagnosis not present

## 2015-11-24 DIAGNOSIS — B351 Tinea unguium: Secondary | ICD-10-CM | POA: Diagnosis not present

## 2015-11-24 DIAGNOSIS — I10 Essential (primary) hypertension: Secondary | ICD-10-CM | POA: Diagnosis not present

## 2015-11-24 DIAGNOSIS — R5383 Other fatigue: Secondary | ICD-10-CM | POA: Diagnosis not present

## 2015-11-24 DIAGNOSIS — Z79899 Other long term (current) drug therapy: Secondary | ICD-10-CM | POA: Diagnosis not present

## 2015-11-24 DIAGNOSIS — Z23 Encounter for immunization: Secondary | ICD-10-CM | POA: Diagnosis not present

## 2015-11-24 DIAGNOSIS — E785 Hyperlipidemia, unspecified: Secondary | ICD-10-CM | POA: Diagnosis not present

## 2015-12-14 DIAGNOSIS — M19071 Primary osteoarthritis, right ankle and foot: Secondary | ICD-10-CM | POA: Diagnosis not present

## 2015-12-14 DIAGNOSIS — B351 Tinea unguium: Secondary | ICD-10-CM | POA: Diagnosis not present

## 2015-12-14 DIAGNOSIS — M659 Synovitis and tenosynovitis, unspecified: Secondary | ICD-10-CM | POA: Diagnosis not present

## 2015-12-14 DIAGNOSIS — E119 Type 2 diabetes mellitus without complications: Secondary | ICD-10-CM | POA: Diagnosis not present

## 2015-12-24 DIAGNOSIS — G8929 Other chronic pain: Secondary | ICD-10-CM | POA: Diagnosis not present

## 2015-12-24 DIAGNOSIS — M25512 Pain in left shoulder: Secondary | ICD-10-CM | POA: Diagnosis not present

## 2015-12-24 DIAGNOSIS — M19012 Primary osteoarthritis, left shoulder: Secondary | ICD-10-CM | POA: Diagnosis not present

## 2015-12-28 DIAGNOSIS — M25512 Pain in left shoulder: Secondary | ICD-10-CM | POA: Diagnosis not present

## 2015-12-28 DIAGNOSIS — M7542 Impingement syndrome of left shoulder: Secondary | ICD-10-CM | POA: Diagnosis not present

## 2016-01-04 DIAGNOSIS — M25512 Pain in left shoulder: Secondary | ICD-10-CM | POA: Diagnosis not present

## 2016-01-04 DIAGNOSIS — M7542 Impingement syndrome of left shoulder: Secondary | ICD-10-CM | POA: Diagnosis not present

## 2016-01-10 DIAGNOSIS — L570 Actinic keratosis: Secondary | ICD-10-CM | POA: Diagnosis not present

## 2016-01-10 DIAGNOSIS — C44319 Basal cell carcinoma of skin of other parts of face: Secondary | ICD-10-CM | POA: Diagnosis not present

## 2016-01-27 DIAGNOSIS — C44319 Basal cell carcinoma of skin of other parts of face: Secondary | ICD-10-CM | POA: Diagnosis not present

## 2016-06-07 DIAGNOSIS — C4441 Basal cell carcinoma of skin of scalp and neck: Secondary | ICD-10-CM | POA: Diagnosis not present

## 2016-06-13 DIAGNOSIS — C490 Malignant neoplasm of connective and soft tissue of head, face and neck: Secondary | ICD-10-CM | POA: Diagnosis not present

## 2016-07-11 DIAGNOSIS — G4733 Obstructive sleep apnea (adult) (pediatric): Secondary | ICD-10-CM | POA: Diagnosis not present

## 2016-07-12 DIAGNOSIS — C4440 Unspecified malignant neoplasm of skin of scalp and neck: Secondary | ICD-10-CM | POA: Diagnosis not present

## 2016-07-12 DIAGNOSIS — C490 Malignant neoplasm of connective and soft tissue of head, face and neck: Secondary | ICD-10-CM | POA: Diagnosis not present

## 2016-09-12 DIAGNOSIS — D3132 Benign neoplasm of left choroid: Secondary | ICD-10-CM | POA: Diagnosis not present

## 2016-09-18 DIAGNOSIS — E1129 Type 2 diabetes mellitus with other diabetic kidney complication: Secondary | ICD-10-CM | POA: Diagnosis not present

## 2016-09-18 DIAGNOSIS — Z9181 History of falling: Secondary | ICD-10-CM | POA: Diagnosis not present

## 2016-09-18 DIAGNOSIS — H6123 Impacted cerumen, bilateral: Secondary | ICD-10-CM | POA: Diagnosis not present

## 2016-09-18 DIAGNOSIS — Z0000 Encounter for general adult medical examination without abnormal findings: Secondary | ICD-10-CM | POA: Diagnosis not present

## 2016-09-18 DIAGNOSIS — D510 Vitamin B12 deficiency anemia due to intrinsic factor deficiency: Secondary | ICD-10-CM | POA: Diagnosis not present

## 2016-09-18 DIAGNOSIS — R5383 Other fatigue: Secondary | ICD-10-CM | POA: Diagnosis not present

## 2016-09-18 DIAGNOSIS — E7800 Pure hypercholesterolemia, unspecified: Secondary | ICD-10-CM | POA: Diagnosis not present

## 2016-09-18 DIAGNOSIS — Z23 Encounter for immunization: Secondary | ICD-10-CM | POA: Diagnosis not present

## 2016-09-18 DIAGNOSIS — Z1389 Encounter for screening for other disorder: Secondary | ICD-10-CM | POA: Diagnosis not present

## 2016-09-25 DIAGNOSIS — R0689 Other abnormalities of breathing: Secondary | ICD-10-CM | POA: Diagnosis not present

## 2016-09-25 DIAGNOSIS — E876 Hypokalemia: Secondary | ICD-10-CM | POA: Diagnosis not present

## 2016-09-27 DIAGNOSIS — Z23 Encounter for immunization: Secondary | ICD-10-CM | POA: Diagnosis not present

## 2016-09-27 DIAGNOSIS — I509 Heart failure, unspecified: Secondary | ICD-10-CM | POA: Diagnosis not present

## 2016-09-27 DIAGNOSIS — E1143 Type 2 diabetes mellitus with diabetic autonomic (poly)neuropathy: Secondary | ICD-10-CM | POA: Diagnosis not present

## 2016-09-27 DIAGNOSIS — I10 Essential (primary) hypertension: Secondary | ICD-10-CM | POA: Diagnosis not present

## 2016-10-26 DIAGNOSIS — L570 Actinic keratosis: Secondary | ICD-10-CM | POA: Diagnosis not present

## 2016-10-26 DIAGNOSIS — D485 Neoplasm of uncertain behavior of skin: Secondary | ICD-10-CM | POA: Diagnosis not present

## 2016-10-31 DIAGNOSIS — I509 Heart failure, unspecified: Secondary | ICD-10-CM | POA: Diagnosis not present

## 2016-10-31 DIAGNOSIS — R0600 Dyspnea, unspecified: Secondary | ICD-10-CM

## 2016-10-31 DIAGNOSIS — I5032 Chronic diastolic (congestive) heart failure: Secondary | ICD-10-CM | POA: Insufficient documentation

## 2016-10-31 DIAGNOSIS — R0609 Other forms of dyspnea: Secondary | ICD-10-CM

## 2016-10-31 DIAGNOSIS — I10 Essential (primary) hypertension: Secondary | ICD-10-CM | POA: Diagnosis not present

## 2016-10-31 DIAGNOSIS — R7303 Prediabetes: Secondary | ICD-10-CM | POA: Diagnosis not present

## 2016-10-31 DIAGNOSIS — G4733 Obstructive sleep apnea (adult) (pediatric): Secondary | ICD-10-CM | POA: Diagnosis not present

## 2016-10-31 HISTORY — DX: Other forms of dyspnea: R06.09

## 2016-10-31 HISTORY — DX: Chronic diastolic (congestive) heart failure: I50.32

## 2016-10-31 HISTORY — DX: Dyspnea, unspecified: R06.00

## 2016-11-07 DIAGNOSIS — I10 Essential (primary) hypertension: Secondary | ICD-10-CM | POA: Diagnosis not present

## 2016-11-07 DIAGNOSIS — R0609 Other forms of dyspnea: Secondary | ICD-10-CM | POA: Diagnosis not present

## 2016-11-07 DIAGNOSIS — G4733 Obstructive sleep apnea (adult) (pediatric): Secondary | ICD-10-CM | POA: Diagnosis not present

## 2016-11-07 DIAGNOSIS — I509 Heart failure, unspecified: Secondary | ICD-10-CM | POA: Diagnosis not present

## 2016-11-07 DIAGNOSIS — R7303 Prediabetes: Secondary | ICD-10-CM | POA: Diagnosis not present

## 2017-01-02 DIAGNOSIS — R0609 Other forms of dyspnea: Secondary | ICD-10-CM | POA: Diagnosis not present

## 2017-01-02 DIAGNOSIS — R7303 Prediabetes: Secondary | ICD-10-CM | POA: Diagnosis not present

## 2017-01-02 DIAGNOSIS — G4733 Obstructive sleep apnea (adult) (pediatric): Secondary | ICD-10-CM | POA: Diagnosis not present

## 2017-01-02 DIAGNOSIS — I10 Essential (primary) hypertension: Secondary | ICD-10-CM | POA: Diagnosis not present

## 2017-01-02 DIAGNOSIS — I5032 Chronic diastolic (congestive) heart failure: Secondary | ICD-10-CM | POA: Diagnosis not present

## 2017-01-09 DIAGNOSIS — I10 Essential (primary) hypertension: Secondary | ICD-10-CM | POA: Diagnosis not present

## 2017-01-09 DIAGNOSIS — R7303 Prediabetes: Secondary | ICD-10-CM | POA: Diagnosis not present

## 2017-01-09 DIAGNOSIS — R0609 Other forms of dyspnea: Secondary | ICD-10-CM | POA: Diagnosis not present

## 2017-01-09 DIAGNOSIS — G4733 Obstructive sleep apnea (adult) (pediatric): Secondary | ICD-10-CM | POA: Diagnosis not present

## 2017-01-09 DIAGNOSIS — I5032 Chronic diastolic (congestive) heart failure: Secondary | ICD-10-CM | POA: Diagnosis not present

## 2017-01-24 DIAGNOSIS — L570 Actinic keratosis: Secondary | ICD-10-CM | POA: Diagnosis not present

## 2017-01-30 DIAGNOSIS — I10 Essential (primary) hypertension: Secondary | ICD-10-CM | POA: Diagnosis not present

## 2017-01-30 DIAGNOSIS — R0609 Other forms of dyspnea: Secondary | ICD-10-CM | POA: Diagnosis not present

## 2017-01-30 DIAGNOSIS — G4733 Obstructive sleep apnea (adult) (pediatric): Secondary | ICD-10-CM | POA: Diagnosis not present

## 2017-01-30 DIAGNOSIS — R7303 Prediabetes: Secondary | ICD-10-CM | POA: Diagnosis not present

## 2017-01-30 DIAGNOSIS — I5032 Chronic diastolic (congestive) heart failure: Secondary | ICD-10-CM | POA: Diagnosis not present

## 2017-02-12 DIAGNOSIS — I10 Essential (primary) hypertension: Secondary | ICD-10-CM | POA: Diagnosis not present

## 2017-02-12 DIAGNOSIS — I5032 Chronic diastolic (congestive) heart failure: Secondary | ICD-10-CM | POA: Diagnosis not present

## 2017-02-12 DIAGNOSIS — R0609 Other forms of dyspnea: Secondary | ICD-10-CM | POA: Diagnosis not present

## 2017-02-12 DIAGNOSIS — R7303 Prediabetes: Secondary | ICD-10-CM | POA: Diagnosis not present

## 2017-02-12 DIAGNOSIS — G4733 Obstructive sleep apnea (adult) (pediatric): Secondary | ICD-10-CM | POA: Diagnosis not present

## 2017-02-21 DIAGNOSIS — L570 Actinic keratosis: Secondary | ICD-10-CM | POA: Diagnosis not present

## 2017-03-27 DIAGNOSIS — I5032 Chronic diastolic (congestive) heart failure: Secondary | ICD-10-CM | POA: Diagnosis not present

## 2017-05-02 DIAGNOSIS — C44219 Basal cell carcinoma of skin of left ear and external auricular canal: Secondary | ICD-10-CM | POA: Diagnosis not present

## 2017-05-30 DIAGNOSIS — C44229 Squamous cell carcinoma of skin of left ear and external auricular canal: Secondary | ICD-10-CM | POA: Diagnosis not present

## 2017-06-13 DIAGNOSIS — E1129 Type 2 diabetes mellitus with other diabetic kidney complication: Secondary | ICD-10-CM | POA: Diagnosis not present

## 2017-06-13 DIAGNOSIS — M1990 Unspecified osteoarthritis, unspecified site: Secondary | ICD-10-CM | POA: Diagnosis not present

## 2017-06-13 DIAGNOSIS — E785 Hyperlipidemia, unspecified: Secondary | ICD-10-CM | POA: Diagnosis not present

## 2017-06-13 DIAGNOSIS — I509 Heart failure, unspecified: Secondary | ICD-10-CM | POA: Diagnosis not present

## 2017-06-13 DIAGNOSIS — E538 Deficiency of other specified B group vitamins: Secondary | ICD-10-CM | POA: Diagnosis not present

## 2017-06-13 DIAGNOSIS — R5383 Other fatigue: Secondary | ICD-10-CM | POA: Diagnosis not present

## 2017-06-28 DIAGNOSIS — I10 Essential (primary) hypertension: Secondary | ICD-10-CM | POA: Diagnosis not present

## 2017-06-28 DIAGNOSIS — E7800 Pure hypercholesterolemia, unspecified: Secondary | ICD-10-CM | POA: Diagnosis not present

## 2017-06-28 DIAGNOSIS — E1129 Type 2 diabetes mellitus with other diabetic kidney complication: Secondary | ICD-10-CM | POA: Diagnosis not present

## 2017-08-07 DIAGNOSIS — G4733 Obstructive sleep apnea (adult) (pediatric): Secondary | ICD-10-CM | POA: Diagnosis not present

## 2017-09-17 DIAGNOSIS — C44329 Squamous cell carcinoma of skin of other parts of face: Secondary | ICD-10-CM | POA: Diagnosis not present

## 2017-09-17 DIAGNOSIS — L821 Other seborrheic keratosis: Secondary | ICD-10-CM | POA: Diagnosis not present

## 2017-09-17 DIAGNOSIS — L570 Actinic keratosis: Secondary | ICD-10-CM | POA: Diagnosis not present

## 2017-09-17 DIAGNOSIS — C44311 Basal cell carcinoma of skin of nose: Secondary | ICD-10-CM | POA: Diagnosis not present

## 2017-12-17 DIAGNOSIS — L570 Actinic keratosis: Secondary | ICD-10-CM | POA: Diagnosis not present

## 2017-12-17 DIAGNOSIS — D2239 Melanocytic nevi of other parts of face: Secondary | ICD-10-CM | POA: Diagnosis not present

## 2017-12-17 DIAGNOSIS — L821 Other seborrheic keratosis: Secondary | ICD-10-CM | POA: Diagnosis not present

## 2017-12-17 DIAGNOSIS — C44319 Basal cell carcinoma of skin of other parts of face: Secondary | ICD-10-CM | POA: Diagnosis not present

## 2017-12-24 DIAGNOSIS — I10 Essential (primary) hypertension: Secondary | ICD-10-CM | POA: Diagnosis not present

## 2017-12-24 DIAGNOSIS — D519 Vitamin B12 deficiency anemia, unspecified: Secondary | ICD-10-CM | POA: Diagnosis not present

## 2017-12-24 DIAGNOSIS — E785 Hyperlipidemia, unspecified: Secondary | ICD-10-CM | POA: Diagnosis not present

## 2017-12-24 DIAGNOSIS — H6123 Impacted cerumen, bilateral: Secondary | ICD-10-CM | POA: Diagnosis not present

## 2017-12-24 DIAGNOSIS — E1143 Type 2 diabetes mellitus with diabetic autonomic (poly)neuropathy: Secondary | ICD-10-CM | POA: Diagnosis not present

## 2017-12-24 DIAGNOSIS — Z0000 Encounter for general adult medical examination without abnormal findings: Secondary | ICD-10-CM | POA: Diagnosis not present

## 2017-12-24 DIAGNOSIS — I5030 Unspecified diastolic (congestive) heart failure: Secondary | ICD-10-CM | POA: Diagnosis not present

## 2018-01-02 DIAGNOSIS — C44329 Squamous cell carcinoma of skin of other parts of face: Secondary | ICD-10-CM | POA: Diagnosis not present

## 2018-01-23 ENCOUNTER — Telehealth: Payer: Self-pay | Admitting: Cardiology

## 2018-01-23 MED ORDER — FUROSEMIDE 40 MG PO TABS: 40.0000 mg | ORAL_TABLET | Freq: Two times a day (BID) | ORAL | 0 refills | Status: DC

## 2018-01-23 NOTE — Telephone Encounter (Signed)
Patient is scheduled for Tuesday with Dr Agustin Cree and has seen him at [facility] in the past. He needs a refill on his Lasix to last him til appt for 01/31/2018. He follows [facility] thru [facility] and he has gained some weight and they do not want him to not ave lasix. Please call to the [facility] on [address]. He is out!!

## 2018-01-23 NOTE — Telephone Encounter (Signed)
Spoke with patient's wife to make aware that furosemide prescription was sent in so patient does not run out before appointment on 01/29/2018.

## 2018-01-29 ENCOUNTER — Encounter: Payer: Self-pay | Admitting: *Deleted

## 2018-01-29 ENCOUNTER — Ambulatory Visit: Payer: Medicare Other | Admitting: Cardiology

## 2018-01-29 ENCOUNTER — Encounter: Payer: Self-pay | Admitting: Cardiology

## 2018-01-29 VITALS — BP 116/68 | HR 88 | Ht 67.0 in | Wt 282.8 lb

## 2018-01-29 DIAGNOSIS — G4733 Obstructive sleep apnea (adult) (pediatric): Secondary | ICD-10-CM | POA: Diagnosis not present

## 2018-01-29 DIAGNOSIS — I10 Essential (primary) hypertension: Secondary | ICD-10-CM | POA: Diagnosis not present

## 2018-01-29 DIAGNOSIS — I5032 Chronic diastolic (congestive) heart failure: Secondary | ICD-10-CM

## 2018-01-29 NOTE — Patient Instructions (Signed)
Medication Instructions:  Your physician recommends that you continue on your current medications as directed. Please refer to the Current Medication list given to you today.   Labwork: None  Testing/Procedures: You had an EKG today.   Your physician has requested that you have an echocardiogram. Echocardiography is a painless test that uses sound waves to create images of your heart. It provides your doctor with information about the size and shape of your heart and how well your heart's chambers and valves are working. This procedure takes approximately one hour. There are no restrictions for this procedure.    Follow-Up: Your physician wants you to follow-up in: 6 months. You will receive a reminder letter in the mail two months in advance. If you don't receive a letter, please call our office to schedule the follow-up appointment.   Any Other Special Instructions Will Be Listed Below (If Applicable).     If you need a refill on your cardiac medications before your next appointment, please call your pharmacy.

## 2018-01-29 NOTE — Progress Notes (Signed)
Cardiology Office Note:    Date:  01/29/2018   ID:  Samuel Garza, DOB 03-26-1934, MRN [id number]  PCP:  Angelina Sheriff, MD  Cardiologist:  Jenne Campus, MD    Referring MD: Angelina Sheriff, MD   Chief Complaint  Patient presents with  . Follow-up  Doing well  History of Present Illness:    Samuel Garza is a 84 y.o. male with diastolic congestive heart failure.  Complaint of having some swelling of lower extremities at evening time.  Also exertional shortness of breath but this is nothing new.  He still works during the week as well as on the weekend.  Very happy doing that.  Denies have any chest pain tightness squeezing pressure burning chest.  Past Medical History:  Diagnosis Date  . Angina   . Chest pain at rest   . Hypertension   . Obesities, morbid ([place])   . Shortness of breath   . Sleep apnea   . Stroke [facility])     Past Surgical History:  Procedure Laterality Date  . APPENDECTOMY  1947   age 48  . CARDIAC CATHETERIZATION    . GALLBLADDER SURGERY  12/2010   age 48  . Left knee arthroscopy  05/2009   age 55    Current Medications: Current Meds  Medication Sig  . aspirin EC 81 MG tablet Take 81 mg by mouth daily.    . furosemide (LASIX) 40 MG tablet Take 1 tablet (40 mg total) by mouth 2 (two) times daily.  . potassium chloride (K-DUR,KLOR-CON) 10 MEQ tablet Take 1 tablet by mouth daily.     Allergies:   Patient has no known allergies.   Social History   Socioeconomic History  . Marital status: Married    Spouse name: Not on file  . Number of children: 4  . Years of education: Not on file  . Highest education level: Not on file  Occupational History  . Not on file  Social Needs  . Financial resource strain: Not on file  . Food insecurity:    Worry: Not on file    Inability: Not on file  . Transportation needs:    Medical: Not on file    Non-medical: Not on file  Tobacco Use  . Smoking status: Former Research scientist (life sciences)  . Smokeless tobacco:  Never Used  Substance and Sexual Activity  . Alcohol use: No  . Drug use: No  . Sexual activity: Yes  Lifestyle  . Physical activity:    Days per week: Not on file    Minutes per session: Not on file  . Stress: Not on file  Relationships  . Social connections:    Talks on phone: Not on file    Gets together: Not on file    Attends religious service: Not on file    Active member of club or organization: Not on file    Attends meetings of clubs or organizations: Not on file    Relationship status: Not on file  Other Topics Concern  . Not on file  Social History Narrative  . Not on file     Family History: The patient's family history includes Cancer in his sister; Coronary artery disease in his father; Diabetes in his sister; Heart attack in his brother; Stroke in his father. ROS:   Please see the history of present illness.    All 14 point review of systems negative except as described per history of present illness  EKGs/Labs/Other Studies Reviewed:      Recent Labs: No results found for requested labs within last 8760 hours.  Recent Lipid Panel    Component Value Date/Time   CHOL 171 09/28/2011 0500   TRIG 117 09/28/2011 0500   HDL 44 09/28/2011 0500   CHOLHDL 3.9 09/28/2011 0500   VLDL 23 09/28/2011 0500   LDLCALC 104 (H) 09/28/2011 0500    Physical Exam:    VS:  BP 116/68   Pulse 88   Ht 5\' 7"  (1.702 m)   Wt 282 lb 12.8 oz (128.3 kg)   SpO2 95%   BMI 44.29 kg/m     Wt Readings from Last 3 Encounters:  01/29/18 282 lb 12.8 oz (128.3 kg)  09/28/11 271 lb 9.6 oz (123.2 kg)     GEN:  Well nourished, well developed in no acute distress HEENT: Normal NECK: No JVD; No carotid bruits LYMPHATICS: No lymphadenopathy CARDIAC: RRR, no murmurs, no rubs, no gallops RESPIRATORY:  Clear to auscultation without rales, wheezing or rhonchi  ABDOMEN: Soft, non-tender, non-distended MUSCULOSKELETAL:  No edema; No deformity  SKIN: Warm and dry LOWER EXTREMITIES: no  swelling NEUROLOGIC:  Alert and oriented x 3 PSYCHIATRIC:  Normal affect   ASSESSMENT:    1. Chronic diastolic congestive heart failure ([place])   2. Essential hypertension   3. Obstructive sleep apnea syndrome   4. Obesity, Class III, BMI 40-49.9 (morbid obesity) ([place])    PLAN:    In order of problems listed above:  1. Chronic diastolic congestive heart failure: I will schedule him to have another echocardiogram to assess left ventricular ejection fraction.  On the EKG does have right bundle branch block I will look at the right side of his heart. 2. Essential hypertension: Blood pressure well controlled continue present management. 3. Obstructive sleep apnea: That being followed by primary care physician he does use mask on the regular basis.  I encouraged him to continue. 4. Obesity he lost 6 pounds since I seen him last time.  I encouraged him to do some exercises and also watch his diet.  Overall he seems to be doing well.  He is followed by primary care physician Dr. Lin Landsman.  I call his office and get report of his blood test.  Apparently he was find to have borderline diabetes metformin has been given to him but he was not able to tolerate it.   Medication Adjustments/Labs and Tests Ordered: Current medicines are reviewed at length with the patient today.  Concerns regarding medicines are outlined above.  No orders of the defined types were placed in this encounter.  Medication changes: No orders of the defined types were placed in this encounter.   Signed, Park Liter, MD, [facility] 01/29/2018 10:06 AM    [place]

## 2018-02-04 ENCOUNTER — Telehealth: Payer: Self-pay | Admitting: Cardiology

## 2018-02-04 MED ORDER — FUROSEMIDE 40 MG PO TABS: 40.0000 mg | ORAL_TABLET | Freq: Two times a day (BID) | ORAL | 3 refills | Status: DC

## 2018-02-04 MED ORDER — POTASSIUM CHLORIDE CRYS ER 10 MEQ PO TBCR: 10.0000 meq | EXTENDED_RELEASE_TABLET | Freq: Every day | ORAL | 3 refills | Status: DC

## 2018-02-04 NOTE — Telephone Encounter (Signed)
Patient had office visit on 01/29/2018. Patient called needing refills for furosemide and potassium. Refills sent to [facility] in [place].

## 2018-02-04 NOTE — Telephone Encounter (Signed)
Has questions about meds that were supposed to be called in

## 2018-02-26 ENCOUNTER — Ambulatory Visit (HOSPITAL_BASED_OUTPATIENT_CLINIC_OR_DEPARTMENT_OTHER)
Admission: RE | Admit: 2018-02-26 | Discharge: 2018-02-26 | Disposition: A | Discharge status: Home / Self Care | Payer: Medicare Other | Source: Ambulatory Visit | Attending: Cardiology | Admitting: Cardiology

## 2018-02-26 DIAGNOSIS — I350 Nonrheumatic aortic (valve) stenosis: Secondary | ICD-10-CM | POA: Insufficient documentation

## 2018-02-26 DIAGNOSIS — I110 Hypertensive heart disease with heart failure: Secondary | ICD-10-CM | POA: Diagnosis not present

## 2018-02-26 DIAGNOSIS — I5032 Chronic diastolic (congestive) heart failure: Secondary | ICD-10-CM | POA: Diagnosis not present

## 2018-02-26 DIAGNOSIS — I10 Essential (primary) hypertension: Secondary | ICD-10-CM

## 2018-02-26 DIAGNOSIS — G4733 Obstructive sleep apnea (adult) (pediatric): Secondary | ICD-10-CM | POA: Insufficient documentation

## 2018-02-26 DIAGNOSIS — Z6841 Body Mass Index (BMI) 40.0 and over, adult: Secondary | ICD-10-CM | POA: Diagnosis not present

## 2018-02-26 NOTE — Progress Notes (Signed)
Echocardiogram 2D Echocardiogram has been performed.  Samuel Garza 02/26/2018, 9:51 AM

## 2018-03-05 ENCOUNTER — Telehealth: Payer: Self-pay | Admitting: Cardiology

## 2018-03-05 ENCOUNTER — Other Ambulatory Visit: Payer: Self-pay

## 2018-03-05 MED ORDER — POTASSIUM CHLORIDE CRYS ER 10 MEQ PO TBCR: 10.0000 meq | EXTENDED_RELEASE_TABLET | Freq: Every day | ORAL | 2 refills | Status: DC

## 2018-03-05 NOTE — Telephone Encounter (Signed)
Please put in refill for potassium 90 day refill to [facility] on [address]

## 2018-03-05 NOTE — Telephone Encounter (Signed)
Med refill sent

## 2018-03-18 DIAGNOSIS — C44329 Squamous cell carcinoma of skin of other parts of face: Secondary | ICD-10-CM | POA: Diagnosis not present

## 2018-06-27 ENCOUNTER — Other Ambulatory Visit: Payer: Self-pay | Admitting: Cardiology

## 2018-06-27 ENCOUNTER — Telehealth: Payer: Self-pay | Admitting: Cardiology

## 2018-06-27 MED ORDER — POTASSIUM CHLORIDE CRYS ER 10 MEQ PO TBCR: 10.0000 meq | EXTENDED_RELEASE_TABLET | Freq: Every day | ORAL | 0 refills | Status: DC

## 2018-06-27 MED ORDER — POTASSIUM CHLORIDE CRYS ER 10 MEQ PO TBCR: 10.0000 meq | EXTENDED_RELEASE_TABLET | Freq: Every day | ORAL | 1 refills | Status: DC

## 2018-06-27 NOTE — Telephone Encounter (Signed)
Samuel Garza [phone]   [facility] [place]   potassium chloride (K-DUR,KLOR-CON) 10 MEQ tablet  [name] called to say he has run out of his Potassium pills, that he takes them 2 a day like he does his  Lassix and he keeps running out, so he wanted to know if you could call in a prescription for it, please call and advise.

## 2018-06-27 NOTE — Telephone Encounter (Signed)
Has questions about his Lasix and Potassium dosages

## 2018-06-27 NOTE — Telephone Encounter (Signed)
Refill for potassium sent to [facility] in [place] as requested.

## 2018-06-27 NOTE — Telephone Encounter (Signed)
Patient thought he was supposed to be taking potassium 10 mEq twice daily. Advised patient to take this medication once daily as prescribed. Patient verbalized understanding. Scheduled follow up appointment from recall for Thursday, 08/08/18 at 10:40 am. No further questions.

## 2018-06-27 NOTE — Telephone Encounter (Signed)
Refilled potassium. Patient informed.

## 2018-07-10 DIAGNOSIS — G4733 Obstructive sleep apnea (adult) (pediatric): Secondary | ICD-10-CM | POA: Diagnosis not present

## 2018-08-07 ENCOUNTER — Other Ambulatory Visit: Payer: Self-pay | Admitting: Cardiology

## 2018-08-08 ENCOUNTER — Ambulatory Visit: Payer: Medicare Other | Admitting: Cardiology

## 2018-08-15 ENCOUNTER — Ambulatory Visit: Payer: Medicare Other | Admitting: Cardiology

## 2018-08-15 VITALS — BP 140/66 | HR 81 | Ht 67.0 in | Wt 299.8 lb

## 2018-08-15 DIAGNOSIS — I10 Essential (primary) hypertension: Secondary | ICD-10-CM

## 2018-08-15 DIAGNOSIS — R0609 Other forms of dyspnea: Secondary | ICD-10-CM

## 2018-08-15 DIAGNOSIS — I5032 Chronic diastolic (congestive) heart failure: Secondary | ICD-10-CM

## 2018-08-15 NOTE — Progress Notes (Signed)
Cardiology Office Note:    Date:  08/15/2018   ID:  Samuel Garza, DOB [birth year], MRN [id number]  PCP:  Angelina Sheriff, MD  Cardiologist:  Jenne Campus, MD    Referring MD: Angelina Sheriff, MD   Chief Complaint  Patient presents with  . Follow-up  Doing well  History of Present Illness:    Samuel Garza is a 84 y.o. male with diastolic congestive heart failure.  Overall he is doing well still works every single day he enjoyed he does not take his diuretic for day he takes diuretic at night.  He said he does not want to go to the restroom continuously.  No unusual shortness of breath the biggest complaint is pain in the left knee.  Past Medical History:  Diagnosis Date  . Angina   . Chest pain at rest   . Hypertension   . Obesities, morbid ([place])   . Shortness of breath   . Sleep apnea   . Stroke [facility])     Past Surgical History:  Procedure Laterality Date  . APPENDECTOMY  1947   age 89  . CARDIAC CATHETERIZATION    . GALLBLADDER SURGERY  12/2010   age 36  . Left knee arthroscopy  05/2009   age 12    Current Medications: Current Meds  Medication Sig  . aspirin EC 81 MG tablet Take 81 mg by mouth daily.    . furosemide (LASIX) 40 MG tablet TAKE 1 TABLET(40 MG) BY MOUTH TWICE DAILY  . potassium chloride (K-DUR,KLOR-CON) 10 MEQ tablet Take 1 tablet (10 mEq total) by mouth daily.     Allergies:   Patient has no known allergies.   Social History   Socioeconomic History  . Marital status: Married    Spouse name: Not on file  . Number of children: 4  . Years of education: Not on file  . Highest education level: Not on file  Occupational History  . Not on file  Social Needs  . Financial resource strain: Not on file  . Food insecurity:    Worry: Not on file    Inability: Not on file  . Transportation needs:    Medical: Not on file    Non-medical: Not on file  Tobacco Use  . Smoking status: Former Research scientist (life sciences)  . Smokeless tobacco: Never Used    Substance and Sexual Activity  . Alcohol use: No  . Drug use: No  . Sexual activity: Yes  Lifestyle  . Physical activity:    Days per week: Not on file    Minutes per session: Not on file  . Stress: Not on file  Relationships  . Social connections:    Talks on phone: Not on file    Gets together: Not on file    Attends religious service: Not on file    Active member of club or organization: Not on file    Attends meetings of clubs or organizations: Not on file    Relationship status: Not on file  Other Topics Concern  . Not on file  Social History Narrative  . Not on file     Family History: The patient's family history includes Cancer in his sister; Coronary artery disease in his father; Diabetes in his sister; Heart attack in his brother; Stroke in his father. ROS:   Please see the history of present illness.    All 14 point review of systems negative except as described per history of present  illness  EKGs/Labs/Other Studies Reviewed:      Recent Labs: No results found for requested labs within last 8760 hours.  Recent Lipid Panel    Component Value Date/Time   CHOL 171 09/28/2011 0500   TRIG 117 09/28/2011 0500   HDL 44 09/28/2011 0500   CHOLHDL 3.9 09/28/2011 0500   VLDL 23 09/28/2011 0500   LDLCALC 104 (H) 09/28/2011 0500    Physical Exam:    VS:  BP 140/66   Pulse 81   Ht 5\' 7"  (1.702 m)   Wt 299 lb 12.8 oz (136 kg)   SpO2 96%   BMI 46.96 kg/m     Wt Readings from Last 3 Encounters:  08/15/18 299 lb 12.8 oz (136 kg)  01/29/18 282 lb 12.8 oz (128.3 kg)  09/28/11 271 lb 9.6 oz (123.2 kg)     GEN:  Well nourished, well developed in no acute distress HEENT: Normal NECK: No JVD; No carotid bruits LYMPHATICS: No lymphadenopathy CARDIAC: RRR, no murmurs, no rubs, no gallops RESPIRATORY:  Clear to auscultation without rales, wheezing or rhonchi  ABDOMEN: Soft, non-tender, non-distended MUSCULOSKELETAL:  No edema; No deformity  SKIN: Warm and  dry LOWER EXTREMITIES: no swelling NEUROLOGIC:  Alert and oriented x 3 PSYCHIATRIC:  Normal affect   ASSESSMENT:    1. Chronic diastolic congestive heart failure ([place])   2. Essential hypertension   3. Obesity, Class III, BMI 40-49.9 (morbid obesity) ([place])   4. Dyspnea on exertion    PLAN:    In order of problems listed above:  1. Chronic diastolic congestive heart rate will check Chem-7 as well as BNP to see if we can augment his therapy. 2. Essential hypertension blood pressure well controlled continue present management. 3. Obesity noted last time when I seen him he lost some weight but he gained it back he understands the problem trying to work on that. 4. Dyspnea on exertion multifactorial will continue encourage him to lose some weight. 5.    Medication Adjustments/Labs and Tests Ordered: Current medicines are reviewed at length with the patient today.  Concerns regarding medicines are outlined above.  No orders of the defined types were placed in this encounter.  Medication changes: No orders of the defined types were placed in this encounter.   Signed, Park Liter, MD, [facility] 08/15/2018 4:38 PM    [place]

## 2018-08-15 NOTE — Patient Instructions (Signed)
Medication Instructions:  Your physician recommends that you continue on your current medications as directed. Please refer to the Current Medication list given to you today.  If you need a refill on your cardiac medications before your next appointment, please call your pharmacy.   Lab work: Your physician recommends that you return for lab work today:  BMP and BNP  If you have labs (blood work) drawn today and your tests are completely normal, you will receive your results only by: Marland Kitchen MyChart Message (if you have MyChart) OR . A paper copy in the mail If you have any lab test that is abnormal or we need to change your treatment, we will call you to review the results.  Testing/Procedures: None.  Follow-Up: At [facility], you and your health needs are our priority.  As part of our continuing mission to provide you with exceptional heart care, we have created designated Provider Care Teams.  These Care Teams include your primary Cardiologist (physician) and Advanced Practice Providers (APPs -  Physician Assistants and Nurse Practitioners) who all work together to provide you with the care you need, when you need it. You will need a follow up appointment in 6 months.  Please call our office 2 months in advance to schedule this appointment.  You may see Jenne Campus, MD or another member of our [place] Provider Team in [place]: Shirlee More, MD . Jyl Heinz, MD  Any Other Special Instructions Will Be Listed Below (If Applicable).

## 2018-08-16 ENCOUNTER — Encounter: Payer: Self-pay | Admitting: Emergency Medicine

## 2018-08-16 LAB — BASIC METABOLIC PANEL
BUN/Creatinine Ratio: 16 (ref 10–24)
BUN: 16 mg/dL (ref 8–27)
CALCIUM: 8.8 mg/dL (ref 8.6–10.2)
CO2: 22 mmol/L (ref 20–29)
CREATININE: 0.98 mg/dL (ref 0.76–1.27)
Chloride: 101 mmol/L (ref 96–106)
GFR calc Af Amer: 83 mL/min/{1.73_m2} (ref >59)
GFR, EST NON AFRICAN AMERICAN: 72 mL/min/{1.73_m2} (ref >59)
GLUCOSE: 118 mg/dL — AB (ref 65–99)
POTASSIUM: 3.8 mmol/L (ref 3.5–5.2)
SODIUM: 138 mmol/L (ref 134–144)

## 2018-08-16 LAB — PRO B NATRIURETIC PEPTIDE: NT-Pro BNP: 40 pg/mL (ref 0–486)

## 2018-10-31 DIAGNOSIS — H43811 Vitreous degeneration, right eye: Secondary | ICD-10-CM | POA: Diagnosis not present

## 2018-10-31 DIAGNOSIS — D3132 Benign neoplasm of left choroid: Secondary | ICD-10-CM | POA: Diagnosis not present

## 2018-11-07 ENCOUNTER — Other Ambulatory Visit: Payer: Self-pay | Admitting: Cardiology

## 2018-12-20 DIAGNOSIS — H18413 Arcus senilis, bilateral: Secondary | ICD-10-CM | POA: Diagnosis not present

## 2018-12-20 DIAGNOSIS — Z961 Presence of intraocular lens: Secondary | ICD-10-CM | POA: Diagnosis not present

## 2018-12-20 DIAGNOSIS — H26491 Other secondary cataract, right eye: Secondary | ICD-10-CM | POA: Diagnosis not present

## 2018-12-20 DIAGNOSIS — H2512 Age-related nuclear cataract, left eye: Secondary | ICD-10-CM | POA: Diagnosis not present

## 2018-12-30 DIAGNOSIS — H2512 Age-related nuclear cataract, left eye: Secondary | ICD-10-CM | POA: Diagnosis not present

## 2018-12-30 DIAGNOSIS — H25812 Combined forms of age-related cataract, left eye: Secondary | ICD-10-CM | POA: Diagnosis not present

## 2019-01-21 ENCOUNTER — Telehealth: Payer: Self-pay | Admitting: Emergency Medicine

## 2019-01-21 DIAGNOSIS — Z961 Presence of intraocular lens: Secondary | ICD-10-CM | POA: Diagnosis not present

## 2019-01-21 MED ORDER — AMLODIPINE BESYLATE 5 MG PO TABS: 5.0000 mg | ORAL_TABLET | Freq: Every day | ORAL | 1 refills | Status: DC

## 2019-01-21 NOTE — Telephone Encounter (Signed)
Samuel Garza from [employer] called about the patient's blood pressure. He reports it was 172/79 last night and 199/ 76 today. Dr. Agustin Cree aware. Patient denies any blurred vision or head ache. He reports he feels fine. Informed patient per Dr. Agustin Cree has advised he start amlodipine 5 mg daily. Medication will be sent in, patient aware. Advised patient to monitor his blood pressure and let us know if it doesn't improve with medication. Patient verbally understands.

## 2019-02-11 ENCOUNTER — Telehealth: Payer: Self-pay | Admitting: Cardiology

## 2019-02-11 NOTE — Telephone Encounter (Signed)
FYI

## 2019-02-11 NOTE — Telephone Encounter (Signed)
Ariella from [facility] called to inform that patient had Amlodipine added and has a BP cuff at work. His BP was 172/78 but went to work and it was 125/69. If you need to contact her call [phone] ext. [phone]

## 2019-02-24 ENCOUNTER — Ambulatory Visit: Payer: Medicare Other | Admitting: Cardiology

## 2019-03-24 ENCOUNTER — Telehealth: Payer: Self-pay | Admitting: Cardiology

## 2019-03-24 NOTE — Telephone Encounter (Signed)
Virtual Visit Pre-Appointment Phone Call  "(Name), I am calling you today to discuss your upcoming appointment. We are currently trying to limit exposure to the virus that causes COVID-19 by seeing patients at home rather than in the office."  1. "What is the BEST phone number to call the day of the visit?" - include this in appointment notes  2. Do you have or have access to (through a family member/friend) a smartphone with video capability that we can use for your visit?" a. If yes - list this number in appt notes as cell (if different from BEST phone #) and list the appointment type as a VIDEO visit in appointment notes b. If no - list the appointment type as a PHONE visit in appointment notes  3. Confirm consent - "In the setting of the current Covid19 crisis, you are scheduled for a (phone or video) visit with your provider on (date) at (time).  Just as we do with many in-office visits, in order for you to participate in this visit, we must obtain consent.  If you'd like, I can send this to your mychart (if signed up) or email for you to review.  Otherwise, I can obtain your verbal consent now.  All virtual visits are billed to your insurance company just like a normal visit would be.  By agreeing to a virtual visit, we'd like you to understand that the technology does not allow for your provider to perform an examination, and thus may limit your provider's ability to fully assess your condition. If your provider identifies any concerns that need to be evaluated in person, we will make arrangements to do so.  Finally, though the technology is pretty good, we cannot assure that it will always work on either your or our end, and in the setting of a video visit, we may have to convert it to a phone-only visit.  In either situation, we cannot ensure that we have a secure connection.  Are you willing to proceed?" STAFF: Did the patient verbally acknowledge consent to telehealth visit? Document  YES/NO here: YES  4. Advise patient to be prepared - "Two hours prior to your appointment, go ahead and check your blood pressure, pulse, oxygen saturation, and your weight (if you have the equipment to check those) and write them all down. When your visit starts, your provider will ask you for this information. If you have an Apple Watch or Kardia device, please plan to have heart rate information ready on the day of your appointment. Please have a pen and paper handy nearby the day of the visit as well."  5. Give patient instructions for MyChart download to smartphone OR Doximity/Doxy.me as below if video visit (depending on what platform provider is using)  6. Inform patient they will receive a phone call 15 minutes prior to their appointment time (may be from unknown caller ID) so they should be prepared to answer    TELEPHONE CALL NOTE  Samuel Garza has been deemed a candidate for a follow-up tele-health visit to limit community exposure during the Covid-19 pandemic. I spoke with the patient via phone to ensure availability of phone/video source, confirm preferred email & phone number, and discuss instructions and expectations.  I reminded Samuel Garza to be prepared with any vital sign and/or heart rhythm information that could potentially be obtained via home monitoring, at the time of his visit. I reminded Samuel Garza to expect a phone call prior to  his visit.  Samuel Garza 03/24/2019 10:03 AM   INSTRUCTIONS FOR DOWNLOADING THE MYCHART APP TO SMARTPHONE  - The patient must first make sure to have activated MyChart and know their login information - If Apple, go to [employer] and type in MyChart in the search bar and download the app. If Android, ask patient to go to [employer] and type in [place] in the search bar and download the app. The app is free but as with any other app downloads, their phone may require them to verify saved payment information or  Apple/Android password.  - The patient will need to then log into the app with their MyChart username and password, and select [place] as their healthcare provider to link the account. When it is time for your visit, go to the MyChart app, find appointments, and click Begin Video Visit. Be sure to Select Allow for your device to access the Microphone and Camera for your visit. You will then be connected, and your provider will be with you shortly.  **If they have any issues connecting, or need assistance please contact MyChart service desk [phone])**  **If using a computer, in order to ensure the best quality for their visit they will need to use either of the following Internet Browsers: [employer], or Google Chrome**  IF USING DOXIMITY or DOXY.ME - The patient will receive a link just prior to their visit by text.     FULL LENGTH CONSENT FOR TELE-HEALTH VISIT   I hereby voluntarily request, consent and authorize [place] and its employed or contracted physicians, physician assistants, nurse practitioners or other licensed health care professionals (the Practitioner), to provide me with telemedicine health care services (the Services") as deemed necessary by the treating Practitioner. I acknowledge and consent to receive the Services by the Practitioner via telemedicine. I understand that the telemedicine visit will involve communicating with the Practitioner through live audiovisual communication technology and the disclosure of certain medical information by electronic transmission. I acknowledge that I have been given the opportunity to request an in-person assessment or other available alternative prior to the telemedicine visit and am voluntarily participating in the telemedicine visit.  I understand that I have the right to withhold or withdraw my consent to the use of telemedicine in the course of my care at any time, without affecting my right to future care  or treatment, and that the Practitioner or I may terminate the telemedicine visit at any time. I understand that I have the right to inspect all information obtained and/or recorded in the course of the telemedicine visit and may receive copies of available information for a reasonable fee.  I understand that some of the potential risks of receiving the Services via telemedicine include:   Delay or interruption in medical evaluation due to technological equipment failure or disruption;  Information transmitted may not be sufficient (e.g. poor resolution of images) to allow for appropriate medical decision making by the Practitioner; and/or   In rare instances, security protocols could fail, causing a breach of personal health information.  Furthermore, I acknowledge that it is my responsibility to provide information about my medical history, conditions and care that is complete and accurate to the best of my ability. I acknowledge that Practitioner's advice, recommendations, and/or decision may be based on factors not within their control, such as incomplete or inaccurate data provided by me or distortions of diagnostic images or specimens that may result from electronic transmissions. I  understand that the practice of medicine is not an exact science and that Practitioner makes no warranties or guarantees regarding treatment outcomes. I acknowledge that I will receive a copy of this consent concurrently upon execution via email to the email address I last provided but may also request a printed copy by calling the office of [place].    I understand that my insurance will be billed for this visit.   I have read or had this consent read to me.  I understand the contents of this consent, which adequately explains the benefits and risks of the Services being provided via telemedicine.   I have been provided ample opportunity to ask questions regarding this consent and the Services and have had  my questions answered to my satisfaction.  I give my informed consent for the services to be provided through the use of telemedicine in my medical care  By participating in this telemedicine visit I agree to the above.

## 2019-04-01 ENCOUNTER — Encounter: Payer: Self-pay | Admitting: Cardiology

## 2019-04-01 ENCOUNTER — Telehealth (INDEPENDENT_AMBULATORY_CARE_PROVIDER_SITE_OTHER): Payer: Medicare Other | Admitting: Cardiology

## 2019-04-01 ENCOUNTER — Other Ambulatory Visit: Payer: Self-pay

## 2019-04-01 VITALS — BP 149/74 | HR 73 | Wt 285.0 lb

## 2019-04-01 DIAGNOSIS — I10 Essential (primary) hypertension: Secondary | ICD-10-CM

## 2019-04-01 DIAGNOSIS — I5032 Chronic diastolic (congestive) heart failure: Secondary | ICD-10-CM

## 2019-04-01 DIAGNOSIS — R0609 Other forms of dyspnea: Secondary | ICD-10-CM

## 2019-04-01 NOTE — Addendum Note (Signed)
Addended by: Ashok Norris on: 04/01/2019 02:07 PM   Modules accepted: Orders

## 2019-04-01 NOTE — Patient Instructions (Addendum)
Medication Instructions:  Your physician recommends that you continue on your current medications as directed. Please refer to the Current Medication list given to you today.  If you need a refill on your cardiac medications before your next appointment, please call your pharmacy.   Lab work: Your physician recommends that you return for lab work in the next couple of weeks: (fasting) bmp, flp   If you have labs (blood work) drawn today and your tests are completely normal, you will receive your results only by: Marland Kitchen MyChart Message (if you have MyChart) OR . A paper copy in the mail If you have any lab test that is abnormal or we need to change your treatment, we will call you to review the results.  Testing/Procedures: Your physician has requested that you have an echocardiogram. Echocardiography is a painless test that uses sound waves to create images of your heart. It provides your doctor with information about the size and shape of your heart and how well your heart's chambers and valves are working. This procedure takes approximately one hour. There are no restrictions for this procedure.    Follow-Up: At [facility], you and your health needs are our priority.  As part of our continuing mission to provide you with exceptional heart care, we have created designated Provider Care Teams.  These Care Teams include your primary Cardiologist (physician) and Advanced Practice Providers (APPs -  Physician Assistants and Nurse Practitioners) who all work together to provide you with the care you need, when you need it. You will need a follow up appointment in 6 months.  Please call our office 2 months in advance to schedule this appointment.  You may see Jenne Campus, MD or another member of our [place] Provider Team in [place]: Shirlee More, MD . Jyl Heinz, MD    Any Other Special Instructions Will Be Listed Below (If Applicable).    Echocardiogram An echocardiogram is  a procedure that uses painless sound waves (ultrasound) to produce an image of the heart. Images from an echocardiogram can provide important information about:  Signs of coronary artery disease (CAD).  Aneurysm detection. An aneurysm is a weak or damaged part of an artery wall that bulges out from the normal force of blood pumping through the body.  Heart size and shape. Changes in the size or shape of the heart can be associated with certain conditions, including heart failure, aneurysm, and CAD.  Heart muscle function.  Heart valve function.  Signs of a past heart attack.  Fluid buildup around the heart.  Thickening of the heart muscle.  A tumor or infectious growth around the heart valves. Tell a health care provider about:  Any allergies you have.  All medicines you are taking, including vitamins, herbs, eye drops, creams, and over-the-counter medicines.  Any blood disorders you have.  Any surgeries you have had.  Any medical conditions you have.  Whether you are pregnant or may be pregnant. What are the risks? Generally, this is a safe procedure. However, problems may occur, including:  Allergic reaction to dye (contrast) that may be used during the procedure. What happens before the procedure? No specific preparation is needed. You may eat and drink normally. What happens during the procedure?   An IV tube may be inserted into one of your veins.  You may receive contrast through this tube. A contrast is an injection that improves the quality of the pictures from your heart.  A gel will be applied to  your chest.  A wand-like tool (transducer) will be moved over your chest. The gel will help to transmit the sound waves from the transducer.  The sound waves will harmlessly bounce off of your heart to allow the heart images to be captured in real-time motion. The images will be recorded on a computer. The procedure may vary among health care providers and  hospitals. What happens after the procedure?  You may return to your normal, everyday life, including diet, activities, and medicines, unless your health care provider tells you not to do that. Summary  An echocardiogram is a procedure that uses painless sound waves (ultrasound) to produce an image of the heart.  Images from an echocardiogram can provide important information about the size and shape of your heart, heart muscle function, heart valve function, and fluid buildup around your heart.  You do not need to do anything to prepare before this procedure. You may eat and drink normally.  After the echocardiogram is completed, you may return to your normal, everyday life, unless your health care provider tells you not to do that. This information is not intended to replace advice given to you by your health care provider. Make sure you discuss any questions you have with your health care provider. Document Released: 10/20/2000 Document Revised: 11/25/2016 Document Reviewed: 11/25/2016 Elsevier Interactive Patient Education  2019 [employer].

## 2019-04-01 NOTE — Progress Notes (Signed)
Virtual Visit via Telephone Note   This visit type was conducted due to national recommendations for restrictions regarding the COVID-19 Pandemic (e.g. social distancing) in an effort to limit this patient's exposure and mitigate transmission in our community.  Due to his co-morbid illnesses, this patient is at least at moderate risk for complications without adequate follow up.  This format is felt to be most appropriate for this patient at this time.  The patient did not have access to video technology/had technical difficulties with video requiring transitioning to audio format only (telephone).  All issues noted in this document were discussed and addressed.  No physical exam could be performed with this format.  Please refer to the patient's chart for his  consent to telehealth for [facility]  Evaluation Performed:  Follow-up visit  This visit type was conducted due to national recommendations for restrictions regarding the COVID-19 Pandemic (e.g. social distancing).  This format is felt to be most appropriate for this patient at this time.  All issues noted in this document were discussed and addressed.  No physical exam was performed (except for noted visual exam findings with Video Visits).  Please refer to the patient's chart (MyChart message for video visits and phone note for telephone visits) for the patient's consent to telehealth for [facility]  Date:  04/01/2019  ID: Samuel Garza, DOB 1934-04-03, MRN [id number]   Patient Location: [place] [address]   Provider location:   [place] [facility]  PCP:  Angelina Sheriff, MD  Cardiologist:  Jenne Campus, MD     Chief Complaint: Doing well  History of Present Illness:    Samuel Garza is a 85 y.o. male  who presents via audio/video conferencing for a telehealth visit today.  With diastolic congestive heart failure, hypertension, morbid obesity swelling of lower extremities  sleep apnea comes today 2 months follow-up overall he is doing very well actually he is in the office working.  Described the fact that he is trying to walk on a regular basis he lost some significant amount of weight and I congratulated him for it.   The patient does not have symptoms concerning for COVID-19 infection (fever, chills, cough, or new SHORTNESS OF BREATH).    Prior CV studies:   The following studies were reviewed today:  Last echocardiogram done last year showed preserved left ventricular ejection fraction mild aortic stenosis     Past Medical History:  Diagnosis Date  . Angina   . Chest pain at rest   . Hypertension   . Obesities, morbid ([place])   . Shortness of breath   . Sleep apnea   . Stroke [facility])     Past Surgical History:  Procedure Laterality Date  . APPENDECTOMY  1947   age 63  . CARDIAC CATHETERIZATION    . GALLBLADDER SURGERY  12/2010   age 37  . Left knee arthroscopy  05/2009   age 35     Current Meds  Medication Sig  . amLODipine (NORVASC) 5 MG tablet Take 1 tablet (5 mg total) by mouth daily.  Marland Kitchen aspirin EC 81 MG tablet Take 81 mg by mouth daily.    . furosemide (LASIX) 40 MG tablet TAKE 1 TABLET(40 MG) BY MOUTH TWICE DAILY  . potassium chloride (K-DUR,KLOR-CON) 10 MEQ tablet Take 1 tablet (10 mEq total) by mouth daily.      Family History: The patient's family history includes Cancer in his sister;  Coronary artery disease in his father; Diabetes in his sister; Heart attack in his brother; Stroke in his father.   ROS:   Please see the history of present illness.     All other systems reviewed and are negative.   Labs/Other Tests and Data Reviewed:     Recent Labs: 08/15/2018: BUN 16; Creatinine, Ser 0.98; NT-Pro BNP 40; Potassium 3.8; Sodium 138  Recent Lipid Panel    Component Value Date/Time   CHOL 171 09/28/2011 0500   TRIG 117 09/28/2011 0500   HDL 44 09/28/2011 0500   CHOLHDL 3.9 09/28/2011 0500   VLDL 23 09/28/2011 0500    LDLCALC 104 (H) 09/28/2011 0500      Exam:    Vital Signs:  BP (!) 149/74   Pulse 73   Wt 285 lb (129.3 kg)   BMI 44.64 kg/m     Wt Readings from Last 3 Encounters:  04/01/19 285 lb (129.3 kg)  08/15/18 299 lb 12.8 oz (136 kg)  01/29/18 282 lb 12.8 oz (128.3 kg)     Well nourished, well developed in no acute distress. Alert awake oriented x3 happy to be able to talk to me denies having any issues  Diagnosis for this visit:   1. Chronic diastolic congestive heart failure ([place])   2. Essential hypertension   3. Dyspnea on exertion   4. Obesity, Class III, BMI 40-49.9 (morbid obesity) ([place])      ASSESSMENT & PLAN:    1.  Chronic diastolic congestive heart failure successfully managed with medication he takes 40 mg furosemide with potassium will continue that.  Overall he lost some weight and I congratulated him for it.  Will contact his primary care physician to get his Chem-7. 2.  Essential hypertension blood pressure still on the higher side I will continue monitoring I will check his Chem-7 as well as liver function test when he comes to our office for echocardiogram. 3.  Dyspnea on exertion still the same as scheduled him to have echocardiogram done previously he got mild aortic stenosis I will recheck echocardiogram.  I see him back in about 6 months. 4.  Obesity noted he is losing some weight and I congratulated him for it.  COVID-19 Education: The signs and symptoms of COVID-19 were discussed with the patient and how to seek care for testing (follow up with PCP or arrange E-visit).  The importance of social distancing was discussed today.  Patient Risk:   After full review of this patients clinical status, I feel that they are at least moderate risk at this time.  Time:   Today, I have spent 18 minutes with the patient with telehealth technology discussing pt health issues.  I spent 5 minutes reviewing her chart before the visit.  Visit was finished at 1:55 PM.     Medication Adjustments/Labs and Tests Ordered: Current medicines are reviewed at length with the patient today.  Concerns regarding medicines are outlined above.  No orders of the defined types were placed in this encounter.  Medication changes: No orders of the defined types were placed in this encounter.    Disposition: Follow-up 6 months echocardiogram fasting profile Chem-7  Signed, Park Liter, MD, [facility] 04/01/2019 1:56 PM    [facility]

## 2019-04-21 ENCOUNTER — Other Ambulatory Visit: Payer: Self-pay

## 2019-04-21 ENCOUNTER — Ambulatory Visit (INDEPENDENT_AMBULATORY_CARE_PROVIDER_SITE_OTHER): Payer: Medicare Other

## 2019-04-21 DIAGNOSIS — R0609 Other forms of dyspnea: Secondary | ICD-10-CM | POA: Diagnosis not present

## 2019-04-21 DIAGNOSIS — I5032 Chronic diastolic (congestive) heart failure: Secondary | ICD-10-CM

## 2019-04-21 NOTE — Progress Notes (Signed)
2D Echocardiogram performed   04/21/19 Cardell Peach RDCS, RVT

## 2019-04-29 ENCOUNTER — Other Ambulatory Visit: Payer: Self-pay | Admitting: Cardiology

## 2019-04-29 MED ORDER — POTASSIUM CHLORIDE CRYS ER 10 MEQ PO TBCR: 10.0000 meq | EXTENDED_RELEASE_TABLET | Freq: Every day | ORAL | 1 refills | Status: DC

## 2019-04-29 NOTE — Telephone Encounter (Signed)
Potassium 10 meq daily refilled.

## 2019-04-29 NOTE — Telephone Encounter (Signed)
°*  STAT* If patient is at the pharmacy, call can be transferred to refill team.   1. Which medications need to be refilled? (please list name of each medication and dose if known) potassium chloride (K-DUR,KLOR-CON) 10 MEQ tablet  2. Which pharmacy/location (including street and city if local pharmacy) is medication to be sent to?  [facility], [address] AT [place] [phone] (Phone) [phone] (Fax)    3. Do they need a 30 day or 90 day supply? 90 day

## 2019-05-27 ENCOUNTER — Telehealth: Payer: Self-pay | Admitting: Cardiology

## 2019-05-27 NOTE — Telephone Encounter (Signed)
Called patient and he reports that since yesterday his left leg has been swollen and red, it is also warm to the touch. He denies any pain at the site. He does have shortness of breath normally and this is any worse than it normally is. He takes lasix 80 mg once daily. I will consult with Dr. Agustin Cree.

## 2019-05-27 NOTE — Telephone Encounter (Signed)
Patient called concerned that his lower left leg is red and swoillen and he feel is related to his fluid meds and heart, He is wanting to come by the office this afternoon for RJk to look at, [facility] call patient.

## 2019-05-27 NOTE — Telephone Encounter (Signed)
Patient advised to follow up with PCP

## 2019-05-27 NOTE — Telephone Encounter (Signed)
That looks more cellulitis like, needs to talk to his PMD

## 2019-05-29 DIAGNOSIS — I872 Venous insufficiency (chronic) (peripheral): Secondary | ICD-10-CM | POA: Diagnosis not present

## 2019-05-29 DIAGNOSIS — R5383 Other fatigue: Secondary | ICD-10-CM | POA: Diagnosis not present

## 2019-05-29 DIAGNOSIS — Z9181 History of falling: Secondary | ICD-10-CM | POA: Diagnosis not present

## 2019-05-29 DIAGNOSIS — E1143 Type 2 diabetes mellitus with diabetic autonomic (poly)neuropathy: Secondary | ICD-10-CM | POA: Diagnosis not present

## 2019-05-29 DIAGNOSIS — D519 Vitamin B12 deficiency anemia, unspecified: Secondary | ICD-10-CM | POA: Diagnosis not present

## 2019-05-29 DIAGNOSIS — Z0000 Encounter for general adult medical examination without abnormal findings: Secondary | ICD-10-CM | POA: Diagnosis not present

## 2019-05-29 DIAGNOSIS — R600 Localized edema: Secondary | ICD-10-CM | POA: Diagnosis not present

## 2019-06-12 ENCOUNTER — Telehealth: Payer: Self-pay | Admitting: Emergency Medicine

## 2019-06-12 NOTE — Telephone Encounter (Signed)
Aurella, RN with [place] [facility] called to report the patient's monitoring machine has been reported his heart rate fluctuates between 80-103. Patient is not symptomatic she just wanted [place] to be aware. Will consult with Dr. Agustin Cree to make sure there are no other recommendations.

## 2019-07-10 DIAGNOSIS — G4733 Obstructive sleep apnea (adult) (pediatric): Secondary | ICD-10-CM | POA: Diagnosis not present

## 2019-07-15 ENCOUNTER — Other Ambulatory Visit: Payer: Self-pay | Admitting: Cardiology

## 2019-07-15 ENCOUNTER — Telehealth: Payer: Self-pay | Admitting: Cardiology

## 2019-07-21 ENCOUNTER — Telehealth: Payer: Self-pay

## 2019-07-21 NOTE — Telephone Encounter (Signed)
Aurella, RN called to let us know that patient's O2 has been running at least 95, but sometimes has dropped and will slowly rise again. She asked what parameters we would like. I have advised if they drop below 90 to let us know. She has also advised the patient if his O2 is in the lower 90's and he feels bad or if they are below 90 he needs to contact the office.

## 2019-08-09 ENCOUNTER — Other Ambulatory Visit: Payer: Self-pay | Admitting: Cardiology

## 2019-08-14 DIAGNOSIS — E1165 Type 2 diabetes mellitus with hyperglycemia: Secondary | ICD-10-CM | POA: Diagnosis not present

## 2019-08-14 DIAGNOSIS — R42 Dizziness and giddiness: Secondary | ICD-10-CM | POA: Diagnosis not present

## 2019-09-09 DIAGNOSIS — E1165 Type 2 diabetes mellitus with hyperglycemia: Secondary | ICD-10-CM | POA: Diagnosis not present

## 2019-09-16 DIAGNOSIS — Z9119 Patient's noncompliance with other medical treatment and regimen: Secondary | ICD-10-CM | POA: Diagnosis not present

## 2019-09-16 DIAGNOSIS — E1165 Type 2 diabetes mellitus with hyperglycemia: Secondary | ICD-10-CM | POA: Diagnosis not present

## 2019-09-16 DIAGNOSIS — E7800 Pure hypercholesterolemia, unspecified: Secondary | ICD-10-CM | POA: Diagnosis not present

## 2019-09-26 ENCOUNTER — Other Ambulatory Visit: Payer: Self-pay

## 2019-09-26 ENCOUNTER — Ambulatory Visit: Payer: Medicare Other | Admitting: Cardiology

## 2019-09-26 ENCOUNTER — Ambulatory Visit (INDEPENDENT_AMBULATORY_CARE_PROVIDER_SITE_OTHER): Payer: Medicare Other | Admitting: Cardiology

## 2019-09-26 ENCOUNTER — Encounter: Payer: Self-pay | Admitting: Cardiology

## 2019-09-26 VITALS — BP 130/64 | HR 87 | Ht 67.0 in | Wt 270.0 lb

## 2019-09-26 DIAGNOSIS — I5032 Chronic diastolic (congestive) heart failure: Secondary | ICD-10-CM | POA: Diagnosis not present

## 2019-09-26 DIAGNOSIS — E66813 Obesity, class 3: Secondary | ICD-10-CM

## 2019-09-26 DIAGNOSIS — R0609 Other forms of dyspnea: Secondary | ICD-10-CM

## 2019-09-26 DIAGNOSIS — I10 Essential (primary) hypertension: Secondary | ICD-10-CM | POA: Diagnosis not present

## 2019-09-26 DIAGNOSIS — R079 Chest pain, unspecified: Secondary | ICD-10-CM

## 2019-09-26 DIAGNOSIS — R0600 Dyspnea, unspecified: Secondary | ICD-10-CM

## 2019-09-26 DIAGNOSIS — R7303 Prediabetes: Secondary | ICD-10-CM

## 2019-09-26 HISTORY — DX: Prediabetes: R73.03

## 2019-09-26 NOTE — Progress Notes (Signed)
Cardiology Office Note:    Date:  09/26/2019   ID:  Samuel Garza, DOB 10-09-1934, MRN [id number]  PCP:  Angelina Sheriff, MD  Cardiologist:  Jenne Campus, MD    Referring MD: Angelina Sheriff, MD   Chief Complaint  Patient presents with  . Follow-up    History of Present Illness:    Samuel Garza is a 85 y.o. male with past medical history significant for diastolic congestive heart failure, prediabetes, obesity comes today to my office for follow-up overall doing well, denies having any chest pain, tightness, squeezing, pressure burning chest.  He does have some swelling of lower extremities which is stable and overall he feels good.  Recently he was discovered to have elevated sugar he is working hard on reducing it changed already his diet still trying to be active.  He still works  Past Medical History:  Diagnosis Date  . Angina   . Chest pain at rest   . Hypertension   . Obesities, morbid ([place])   . Shortness of breath   . Sleep apnea   . Stroke [facility])     Past Surgical History:  Procedure Laterality Date  . APPENDECTOMY  1947   age 38  . CARDIAC CATHETERIZATION    . GALLBLADDER SURGERY  12/2010   age 19  . Left knee arthroscopy  05/2009   age 75    Current Medications: Current Meds  Medication Sig  . amLODipine (NORVASC) 5 MG tablet TAKE 1 TABLET(5 MG) BY MOUTH DAILY  . aspirin EC 81 MG tablet Take 81 mg by mouth daily.    . furosemide (LASIX) 40 MG tablet TAKE 1 TABLET(40 MG) BY MOUTH TWICE DAILY  . potassium chloride (K-DUR) 10 MEQ tablet Take 1 tablet (10 mEq total) by mouth daily.     Allergies:   Patient has no known allergies.   Social History   Socioeconomic History  . Marital status: Married    Spouse name: Not on file  . Number of children: 4  . Years of education: Not on file  . Highest education level: Not on file  Occupational History  . Not on file  Social Needs  . Financial resource strain: Not on file  . Food insecurity     Worry: Not on file    Inability: Not on file  . Transportation needs    Medical: Not on file    Non-medical: Not on file  Tobacco Use  . Smoking status: Former Research scientist (life sciences)  . Smokeless tobacco: Never Used  Substance and Sexual Activity  . Alcohol use: No  . Drug use: No  . Sexual activity: Yes  Lifestyle  . Physical activity    Days per week: Not on file    Minutes per session: Not on file  . Stress: Not on file  Relationships  . Social Herbalist on phone: Not on file    Gets together: Not on file    Attends religious service: Not on file    Active member of club or organization: Not on file    Attends meetings of clubs or organizations: Not on file    Relationship status: Not on file  Other Topics Concern  . Not on file  Social History Narrative  . Not on file     Family History: The patient's family history includes Cancer in his sister; Coronary artery disease in his father; Diabetes in his sister; Heart attack in his brother;  Stroke in his father. ROS:   Please see the history of present illness.    All 14 point review of systems negative except as described per history of present illness  EKGs/Labs/Other Studies Reviewed:      Recent Labs: No results found for requested labs within last 8760 hours.  Recent Lipid Panel    Component Value Date/Time   CHOL 171 09/28/2011 0500   TRIG 117 09/28/2011 0500   HDL 44 09/28/2011 0500   CHOLHDL 3.9 09/28/2011 0500   VLDL 23 09/28/2011 0500   LDLCALC 104 (H) 09/28/2011 0500    Physical Exam:    VS:  BP 130/64   Pulse 87   Ht 5\' 7"  (1.702 m)   Wt 270 lb (122.5 kg)   SpO2 97%   BMI 42.29 kg/m     Wt Readings from Last 3 Encounters:  09/26/19 270 lb (122.5 kg)  04/01/19 285 lb (129.3 kg)  08/15/18 299 lb 12.8 oz (136 kg)     GEN:  Well nourished, well developed in no acute distress HEENT: Normal NECK: No JVD; No carotid bruits LYMPHATICS: No lymphadenopathy CARDIAC: RRR, no murmurs, no rubs, no  gallops RESPIRATORY:  Clear to auscultation without rales, wheezing or rhonchi  ABDOMEN: Soft, non-tender, non-distended MUSCULOSKELETAL:  No edema; No deformity  SKIN: Warm and dry LOWER EXTREMITIES: no swelling NEUROLOGIC:  Alert and oriented x 3 PSYCHIATRIC:  Normal affect   ASSESSMENT:    1. Chronic diastolic congestive heart failure ([place])   2. Essential hypertension   3. Dyspnea on exertion   4. Chest pain at rest   5. Obesity, Class III, BMI 40-49.9 (morbid obesity) ([place])   6. Prediabetes    PLAN:    In order of problems listed above:  1. Chronic diastolic congestive heart failure stable from that point review I will ask him to have Chem-7 as well as proBNP today.  The purpose of it is to see if we can increase diuretic to improve swelling of lower extremities.  Overall he feels fine therefore I do not think there will be heart push towards changing something about those 2 tests will help [place] make a decision. 2. Essential hypertension blood pressure well controlled continue present management 3. Dyspnea on exertion: Doing well from that point of view. 4. Chest pain denies having any 5. Obesity he is working hard on dropping some weight 6. Prediabetes with talk about type of diabetes that he is developing we talked about need to exercise he understand he will try to do it.  He already changed his diet.  He try to eliminate carbohydrates.   Medication Adjustments/Labs and Tests Ordered: Current medicines are reviewed at length with the patient today.  Concerns regarding medicines are outlined above.  No orders of the defined types were placed in this encounter.  Medication changes: No orders of the defined types were placed in this encounter.   Signed, Park Liter, MD, [facility] 09/26/2019 8:49 AM    [place]

## 2019-09-26 NOTE — Patient Instructions (Signed)
Medication Instructions:  Your physician recommends that you continue on your current medications as directed. Please refer to the Current Medication list given to you today.  *If you need a refill on your cardiac medications before your next appointment, please call your pharmacy*  Lab Work: Your physician recommends that you return for lab work today: Bmp, pro bnp   If you have labs (blood work) drawn today and your tests are completely normal, you will receive your results only by: Marland Kitchen MyChart Message (if you have MyChart) OR . A paper copy in the mail If you have any lab test that is abnormal or we need to change your treatment, we will call you to review the results.  Testing/Procedures: None.   Follow-Up: At [facility], you and your health needs are our priority.  As part of our continuing mission to provide you with exceptional heart care, we have created designated Provider Care Teams.  These Care Teams include your primary Cardiologist (physician) and Advanced Practice Providers (APPs -  Physician Assistants and Nurse Practitioners) who all work together to provide you with the care you need, when you need it.  Your next appointment:   6 month(s)  The format for your next appointment:   In Person  Provider:   Jenne Campus, MD  Other Instructions

## 2019-09-26 NOTE — Addendum Note (Signed)
Addended by: Ashok Norris on: 09/26/2019 08:54 AM   Modules accepted: Orders

## 2019-09-27 LAB — BASIC METABOLIC PANEL
BUN/Creatinine Ratio: 13 (ref 10–24)
BUN: 14 mg/dL (ref 8–27)
CO2: 26 mmol/L (ref 20–29)
Calcium: 9.6 mg/dL (ref 8.6–10.2)
Chloride: 96 mmol/L (ref 96–106)
Creatinine, Ser: 1.11 mg/dL (ref 0.76–1.27)
GFR calc Af Amer: 71 mL/min/{1.73_m2} (ref >59)
GFR calc non Af Amer: 61 mL/min/{1.73_m2} (ref >59)
Glucose: 191 mg/dL — ABNORMAL HIGH (ref 65–99)
Potassium: 4.6 mmol/L (ref 3.5–5.2)
Sodium: 140 mmol/L (ref 134–144)

## 2019-09-27 LAB — PRO B NATRIURETIC PEPTIDE: NT-Pro BNP: 65 pg/mL (ref 0–486)

## 2019-10-16 DIAGNOSIS — E1165 Type 2 diabetes mellitus with hyperglycemia: Secondary | ICD-10-CM | POA: Diagnosis not present

## 2019-10-16 DIAGNOSIS — I10 Essential (primary) hypertension: Secondary | ICD-10-CM | POA: Diagnosis not present

## 2019-10-16 DIAGNOSIS — Z9181 History of falling: Secondary | ICD-10-CM | POA: Diagnosis not present

## 2019-10-29 DIAGNOSIS — G4733 Obstructive sleep apnea (adult) (pediatric): Secondary | ICD-10-CM | POA: Diagnosis not present

## 2019-10-30 ENCOUNTER — Other Ambulatory Visit: Payer: Self-pay | Admitting: Cardiology

## 2019-11-14 DIAGNOSIS — E1143 Type 2 diabetes mellitus with diabetic autonomic (poly)neuropathy: Secondary | ICD-10-CM | POA: Diagnosis not present

## 2019-11-14 DIAGNOSIS — G4733 Obstructive sleep apnea (adult) (pediatric): Secondary | ICD-10-CM | POA: Diagnosis not present

## 2019-11-26 DIAGNOSIS — C4442 Squamous cell carcinoma of skin of scalp and neck: Secondary | ICD-10-CM | POA: Diagnosis not present

## 2019-11-26 DIAGNOSIS — L814 Other melanin hyperpigmentation: Secondary | ICD-10-CM | POA: Diagnosis not present

## 2019-11-26 DIAGNOSIS — L578 Other skin changes due to chronic exposure to nonionizing radiation: Secondary | ICD-10-CM | POA: Diagnosis not present

## 2019-11-26 DIAGNOSIS — C44319 Basal cell carcinoma of skin of other parts of face: Secondary | ICD-10-CM | POA: Diagnosis not present

## 2019-11-26 DIAGNOSIS — D2239 Melanocytic nevi of other parts of face: Secondary | ICD-10-CM | POA: Diagnosis not present

## 2019-11-26 DIAGNOSIS — L570 Actinic keratosis: Secondary | ICD-10-CM | POA: Diagnosis not present

## 2019-12-17 ENCOUNTER — Telehealth: Payer: Self-pay | Admitting: Cardiology

## 2019-12-17 NOTE — Telephone Encounter (Signed)
New Message:     Flint Melter, Nurse  from [facility] called. She said pt's blood pressure  bystolic reading have been coming in under 60. She said he have no symptoms. She  Said if you have any concerns, please contact the patient. If you need to make any adjustments, please call her. If she does not answer, please leave a voicemail.

## 2019-12-17 NOTE — Telephone Encounter (Signed)
I believe the operator meant to say diastolic BP not bystolic

## 2019-12-18 DIAGNOSIS — D0439 Carcinoma in situ of skin of other parts of face: Secondary | ICD-10-CM | POA: Diagnosis not present

## 2019-12-23 NOTE — Telephone Encounter (Signed)
Ariella, nurse from [employer] called because the same issue as last week is still persisting to fall between 58-60. The [facility] Nurse also notes the pt has had an overnight 4 pound weight gain. The pt did not notice any swelling or changes in his breathing. The [facility] Nurse states that the pt normally takes his fluid pill at night to avoid constant urination during work hours. The pt did not urinate over night, but has had frequent urination already dirung the day. The fluid pill may have started working on a delay, but she is not sure  Either way, she needs to report these instances to Dr. Agustin Cree.   Any changes in medication or formal orders need to go directly to the patient to avoid delay in care.

## 2019-12-30 NOTE — Telephone Encounter (Signed)
Could you please clarify what falls between 58-60.  His heart rate?

## 2019-12-31 ENCOUNTER — Other Ambulatory Visit: Payer: Self-pay

## 2019-12-31 ENCOUNTER — Ambulatory Visit: Payer: Medicare Other | Admitting: Cardiology

## 2019-12-31 ENCOUNTER — Telehealth: Payer: Self-pay | Admitting: Cardiology

## 2019-12-31 ENCOUNTER — Encounter: Payer: Self-pay | Admitting: Cardiology

## 2019-12-31 VITALS — BP 140/54 | HR 78 | Ht 67.0 in | Wt 273.0 lb

## 2019-12-31 DIAGNOSIS — R0600 Dyspnea, unspecified: Secondary | ICD-10-CM

## 2019-12-31 DIAGNOSIS — I5032 Chronic diastolic (congestive) heart failure: Secondary | ICD-10-CM

## 2019-12-31 DIAGNOSIS — I10 Essential (primary) hypertension: Secondary | ICD-10-CM

## 2019-12-31 DIAGNOSIS — R7303 Prediabetes: Secondary | ICD-10-CM

## 2019-12-31 DIAGNOSIS — R0609 Other forms of dyspnea: Secondary | ICD-10-CM

## 2019-12-31 NOTE — Addendum Note (Signed)
Addended by: Ashok Norris on: 12/31/2019 03:48 PM   Modules accepted: Orders

## 2019-12-31 NOTE — Patient Instructions (Signed)
Medication Instructions:  Your physician recommends that you continue on your current medications as directed. Please refer to the Current Medication list given to you today.  *If you need a refill on your cardiac medications before your next appointment, please call your pharmacy*  Lab Work: Your physician recommends that you return for lab work today: cmp, pro bnp  If you have labs (blood work) drawn today and your tests are completely normal, you will receive your results only by: Marland Kitchen MyChart Message (if you have MyChart) OR . A paper copy in the mail If you have any lab test that is abnormal or we need to change your treatment, we will call you to review the results.  Testing/Procedures: None.   Follow-Up: At [facility], you and your health needs are our priority.  As part of our continuing mission to provide you with exceptional heart care, we have created designated Provider Care Teams.  These Care Teams include your primary Cardiologist (physician) and Advanced Practice Providers (APPs -  Physician Assistants and Nurse Practitioners) who all work together to provide you with the care you need, when you need it.  Your next appointment:   3 month(s)  The format for your next appointment:   In Person  Provider:   Jenne Campus, MD  Other Instructions

## 2019-12-31 NOTE — Telephone Encounter (Signed)
Spoke with pt, he was given the okay to go ahead and take another fluid pill prior to his appointment but he reports he does not have them with him. He is watching fluids and salt. He is fine with the appointment this afternoon.

## 2019-12-31 NOTE — Telephone Encounter (Signed)
Pt c/o swelling: STAT is pt has developed SOB within 24 hours  1) How much weight have you gained and in what time span? 6 lbs since the 12/29/19  2) If swelling, where is the swelling located? Legs   3) Are you currently taking a fluid pill? Yes   4) Are you currently SOB? Yes  5) Do you have a log of your daily weights (if so, list)? No doesn't have it with him   6) Have you gained 3 pounds in a day or 5 pounds in a week? Yes  7) Have you traveled recently? No   Baldemar is calling stating he is having excessive swelling in his legs. He states he feels the fluid pills are not working, he has gained 6 lbs since Monday 12/29/19. Xaiden has been experiencing nausea and SOB for the past two days. The patient has an appointment scheduled regarding this for today 12/31/19 at 3:15 PM. Please advise.

## 2019-12-31 NOTE — Progress Notes (Signed)
Cardiology Office Note:    Date:  12/31/2019   ID:  Samuel Garza, DOB 1933/11/16, MRN [id number]  PCP:  Angelina Sheriff, MD  Cardiologist:  Jenne Campus, MD    Referring MD: Angelina Sheriff, MD   No chief complaint on file. I gained some weight  History of Present Illness:    Samuel Garza is a 86 y.o. male the past medical history significant for diastolic congestive heart failure, obesity, prediabetes, today to my for follow-up.  He complained of having some weight gain recently and some swelling of lower extremities which is as usual.  Also described to have some nausea that he had yesterday.  Not to the point of throwing up just simply nausea.  Past Medical History:  Diagnosis Date  . Angina   . Chest pain at rest   . Hypertension   . Obesities, morbid ([place])   . Shortness of breath   . Sleep apnea   . Stroke [facility])     Past Surgical History:  Procedure Laterality Date  . APPENDECTOMY  1947   age 14  . CARDIAC CATHETERIZATION    . GALLBLADDER SURGERY  12/2010   age 68  . Left knee arthroscopy  05/2009   age 57    Current Medications: Current Meds  Medication Sig  . amLODipine (NORVASC) 5 MG tablet TAKE 1 TABLET(5 MG) BY MOUTH DAILY  . aspirin EC 81 MG tablet Take 81 mg by mouth daily.    . furosemide (LASIX) 40 MG tablet TAKE 1 TABLET(40 MG) BY MOUTH TWICE DAILY  . KLOR-CON M10 10 MEQ tablet TAKE 1 TABLET(10 MEQ) BY MOUTH DAILY  . metFORMIN (GLUCOPHAGE) 500 MG tablet Take 500 mg by mouth 2 (two) times daily.     Allergies:   Patient has no known allergies.   Social History   Socioeconomic History  . Marital status: Married    Spouse name: Not on file  . Number of children: 4  . Years of education: Not on file  . Highest education level: Not on file  Occupational History  . Not on file  Tobacco Use  . Smoking status: Former Research scientist (life sciences)  . Smokeless tobacco: Never Used  Substance and Sexual Activity  . Alcohol use: No  . Drug use: No  .  Sexual activity: Yes  Other Topics Concern  . Not on file  Social History Narrative  . Not on file   Social Determinants of Health   Financial Resource Strain:   . Difficulty of Paying Living Expenses: Not on file  Food Insecurity:   . Worried About Charity fundraiser in the Last Year: Not on file  . Ran Out of Food in the Last Year: Not on file  Transportation Needs:   . Lack of Transportation (Medical): Not on file  . Lack of Transportation (Non-Medical): Not on file  Physical Activity:   . Days of Exercise per Week: Not on file  . Minutes of Exercise per Session: Not on file  Stress:   . Feeling of Stress : Not on file  Social Connections:   . Frequency of Communication with Friends and Family: Not on file  . Frequency of Social Gatherings with Friends and Family: Not on file  . Attends Religious Services: Not on file  . Active Member of Clubs or Organizations: Not on file  . Attends Archivist Meetings: Not on file  . Marital Status: Not on file  Family History: The patient's family history includes Cancer in his sister; Coronary artery disease in his father; Diabetes in his sister; Heart attack in his brother; Stroke in his father. ROS:   Please see the history of present illness.    All 14 point review of systems negative except as described per history of present illness  EKGs/Labs/Other Studies Reviewed:      Recent Labs: 09/26/2019: BUN 14; Creatinine, Ser 1.11; NT-Pro BNP 65; Potassium 4.6; Sodium 140  Recent Lipid Panel    Component Value Date/Time   CHOL 171 09/28/2011 0500   TRIG 117 09/28/2011 0500   HDL 44 09/28/2011 0500   CHOLHDL 3.9 09/28/2011 0500   VLDL 23 09/28/2011 0500   LDLCALC 104 (H) 09/28/2011 0500    Physical Exam:    VS:  BP (!) 140/54   Pulse 78   Ht 5\' 7"  (1.702 m)   Wt 273 lb (123.8 kg)   SpO2 98%   BMI 42.76 kg/m     Wt Readings from Last 3 Encounters:  12/31/19 273 lb (123.8 kg)  09/26/19 270 lb (122.5  kg)  04/01/19 285 lb (129.3 kg)     GEN:  Well nourished, well developed in no acute distress HEENT: Normal NECK: No JVD; No carotid bruits LYMPHATICS: No lymphadenopathy CARDIAC: RRR, no murmurs, no rubs, no gallops RESPIRATORY:  Clear to auscultation without rales, wheezing or rhonchi  ABDOMEN: Soft, non-tender, non-distended MUSCULOSKELETAL:  No edema; No deformity  SKIN: Warm and dry LOWER EXTREMITIES: 1+ swelling NEUROLOGIC:  Alert and oriented x 3 PSYCHIATRIC:  Normal affect   ASSESSMENT:    1. Chronic diastolic congestive heart failure ([place])   2. Essential hypertension   3. Dyspnea on exertion   4. Prediabetes    PLAN:    In order of problems listed above:  1. Chronic diastolic congestive heart failure.  I will check proBNP as well as Chem-7 today.  Hemodynamically he looks minimally decompensated.  He does have some swelling of lower extremities.  I anticipate need to increase diuretic, however, first I need to check blood work. 2. Essential hypertension.  Blood pressure appears to be controlled on appropriate medication which I will continue. 3. Dyspnea on exertion chronic problem. 4. Prediabetes followed by internal medicine team. 5. Nausea yesterday.  I will check his complete metabolic panel   Medication Adjustments/Labs and Tests Ordered: Current medicines are reviewed at length with the patient today.  Concerns regarding medicines are outlined above.  No orders of the defined types were placed in this encounter.  Medication changes: No orders of the defined types were placed in this encounter.   Signed, Park Liter, MD, [facility] 12/31/2019 3:42 PM    [place]

## 2020-01-01 DIAGNOSIS — C4442 Squamous cell carcinoma of skin of scalp and neck: Secondary | ICD-10-CM | POA: Diagnosis not present

## 2020-01-01 LAB — COMPREHENSIVE METABOLIC PANEL
ALT: 22 IU/L (ref 0–44)
AST: 15 IU/L (ref 0–40)
Albumin/Globulin Ratio: 1.8 (ref 1.2–2.2)
Albumin: 4.2 g/dL (ref 3.6–4.6)
Alkaline Phosphatase: 80 IU/L (ref 39–117)
BUN/Creatinine Ratio: 14 (ref 10–24)
BUN: 13 mg/dL (ref 8–27)
Bilirubin Total: 0.2 mg/dL (ref 0.0–1.2)
CO2: 22 mmol/L (ref 20–29)
Calcium: 9.2 mg/dL (ref 8.6–10.2)
Chloride: 101 mmol/L (ref 96–106)
Creatinine, Ser: 0.91 mg/dL (ref 0.76–1.27)
GFR calc Af Amer: 90 mL/min/{1.73_m2} (ref >59)
GFR calc non Af Amer: 78 mL/min/{1.73_m2} (ref >59)
Globulin, Total: 2.3 g/dL (ref 1.5–4.5)
Glucose: 100 mg/dL — ABNORMAL HIGH (ref 65–99)
Potassium: 4.1 mmol/L (ref 3.5–5.2)
Sodium: 139 mmol/L (ref 134–144)
Total Protein: 6.5 g/dL (ref 6.0–8.5)

## 2020-01-01 LAB — PRO B NATRIURETIC PEPTIDE: NT-Pro BNP: 51 pg/mL (ref 0–486)

## 2020-01-01 NOTE — Addendum Note (Signed)
Addended by: Ashok Norris on: 01/01/2020 05:34 PM   Modules accepted: Orders

## 2020-01-08 ENCOUNTER — Other Ambulatory Visit: Payer: Self-pay | Admitting: Cardiology

## 2020-01-13 ENCOUNTER — Telehealth: Payer: Self-pay | Admitting: Cardiology

## 2020-01-13 DIAGNOSIS — I10 Essential (primary) hypertension: Secondary | ICD-10-CM

## 2020-01-13 DIAGNOSIS — I5032 Chronic diastolic (congestive) heart failure: Secondary | ICD-10-CM

## 2020-01-13 NOTE — Telephone Encounter (Signed)
Pt c/o swelling: STAT is pt has developed SOB within 24 hours  1) How much weight have you gained and in what time span?     2) If swelling, where is the swelling located? No swelling  3) Are you currently taking a fluid pill? Yes  4) Are you currently SOB? No  5) Do you have a log of your daily weights (if so, list)?     01/13/20: 263.5 lbs  01/12/20: 260.5 lbs  01/11/20: 265 lbs  01/10/20: 261 lbs  6) Have you gained 3 pounds in a day or 5 pounds in a week?   7) Have you traveled recently? No   Ariella with [facility] states the patient's weight has been fluctuating drastically, however the patient has not experienced any other symptoms. Ariella also states that the patient has been unable to use CPAP machine. Ariella states a returned phone call to [facility] is not necessary, however if Dr. Agustin Cree has any questions [facility] can be reached at [phone]. (ext: [phone])  Please call the patient to discuss.

## 2020-01-14 NOTE — Telephone Encounter (Signed)
Called patient. He currently takes lasix 80 mg once a day and 10 meq of potassium daily. Will consult with Dr. Agustin Cree to see what he would like to do.

## 2020-01-14 NOTE — Telephone Encounter (Signed)
Unable to reach patient and voicemail was full. Will continue efforts.

## 2020-01-14 NOTE — Telephone Encounter (Signed)
I am not alarmed but there is fluctuation of weight.  We can slightly increase dose of furosemide.  I will advised to take 60 mg daily with 20 mg of potassium.

## 2020-01-18 NOTE — Telephone Encounter (Signed)
Check chem7 and proBNP

## 2020-01-19 NOTE — Telephone Encounter (Signed)
Called patient informed him that he needs lab work per Dr. Agustin Cree. He verbally understands and will come in this week for labs.

## 2020-01-26 DIAGNOSIS — I10 Essential (primary) hypertension: Secondary | ICD-10-CM | POA: Diagnosis not present

## 2020-01-26 DIAGNOSIS — I5032 Chronic diastolic (congestive) heart failure: Secondary | ICD-10-CM | POA: Diagnosis not present

## 2020-01-27 LAB — BASIC METABOLIC PANEL
BUN/Creatinine Ratio: 17 (ref 10–24)
BUN: 19 mg/dL (ref 8–27)
CO2: 25 mmol/L (ref 20–29)
Calcium: 9.6 mg/dL (ref 8.6–10.2)
Chloride: 99 mmol/L (ref 96–106)
Creatinine, Ser: 1.09 mg/dL (ref 0.76–1.27)
GFR calc Af Amer: 72 mL/min/{1.73_m2} (ref >59)
GFR calc non Af Amer: 62 mL/min/{1.73_m2} (ref >59)
Glucose: 130 mg/dL — ABNORMAL HIGH (ref 65–99)
Potassium: 4.2 mmol/L (ref 3.5–5.2)
Sodium: 139 mmol/L (ref 134–144)

## 2020-01-27 LAB — PRO B NATRIURETIC PEPTIDE: NT-Pro BNP: 16 pg/mL (ref 0–486)

## 2020-02-08 ENCOUNTER — Other Ambulatory Visit: Payer: Self-pay | Admitting: Cardiology

## 2020-03-11 DIAGNOSIS — E7800 Pure hypercholesterolemia, unspecified: Secondary | ICD-10-CM | POA: Diagnosis not present

## 2020-03-11 DIAGNOSIS — Z9119 Patient's noncompliance with other medical treatment and regimen: Secondary | ICD-10-CM | POA: Diagnosis not present

## 2020-03-11 DIAGNOSIS — E1165 Type 2 diabetes mellitus with hyperglycemia: Secondary | ICD-10-CM | POA: Diagnosis not present

## 2020-03-24 ENCOUNTER — Encounter: Payer: Self-pay | Admitting: Cardiology

## 2020-03-24 ENCOUNTER — Other Ambulatory Visit: Payer: Self-pay

## 2020-03-24 ENCOUNTER — Ambulatory Visit: Payer: Medicare Other | Admitting: Cardiology

## 2020-03-24 VITALS — BP 124/74 | HR 92 | Ht 67.0 in | Wt 272.0 lb

## 2020-03-24 DIAGNOSIS — R0600 Dyspnea, unspecified: Secondary | ICD-10-CM | POA: Diagnosis not present

## 2020-03-24 DIAGNOSIS — I5032 Chronic diastolic (congestive) heart failure: Secondary | ICD-10-CM

## 2020-03-24 DIAGNOSIS — G4733 Obstructive sleep apnea (adult) (pediatric): Secondary | ICD-10-CM

## 2020-03-24 DIAGNOSIS — I10 Essential (primary) hypertension: Secondary | ICD-10-CM | POA: Diagnosis not present

## 2020-03-24 DIAGNOSIS — R0609 Other forms of dyspnea: Secondary | ICD-10-CM

## 2020-03-24 DIAGNOSIS — E66813 Obesity, class 3: Secondary | ICD-10-CM

## 2020-03-24 NOTE — Progress Notes (Signed)
cho 

## 2020-03-24 NOTE — Progress Notes (Signed)
Cardiology Office Note:    Date:  03/24/2020   ID:  Samuel Garza, DOB [birth year], MRN [id number]  PCP:  Samuel Sheriff, MD  Cardiologist:  Samuel Campus, MD    Referring MD: Samuel Sheriff, MD   Chief Complaint  Patient presents with  . Follow-up  I am doing fine  History of Present Illness:    Samuel Garza is a 86 y.o. male with diastolic congestive heart failure, chronic swelling of lower extremities, atypical chest pain, essential hypertension, morbid obesity, obstructive sleep apnea.  Comes today to my office for follow-up.  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  Denies have any chest pain tightness squeezing pressure burning chest swelling of lower extremities like always there but it does not bother him much.  Past Medical History:  Diagnosis Date  . Angina   . Chest pain at rest   . Chronic diastolic congestive heart failure ([place]) 10/31/2016  . Dyspnea on exertion 10/31/2016  . Hypertension   . Obesities, morbid ([place])   . Obesity, Class III, BMI 40-49.9 (morbid obesity) ([place]) 09/27/2011  . Prediabetes 09/26/2019  . Shortness of breath   . Sleep apnea   . Stroke [facility])     Past Surgical History:  Procedure Laterality Date  . APPENDECTOMY  1947   age 4  . CARDIAC CATHETERIZATION    . GALLBLADDER SURGERY  12/2010   age 62  . Left knee arthroscopy  05/2009   age 65    Current Medications: Current Meds  Medication Sig  . amLODipine (NORVASC) 5 MG tablet TAKE 1 TABLET(5 MG) BY MOUTH DAILY  . aspirin EC 81 MG tablet Take 81 mg by mouth daily.    . furosemide (LASIX) 40 MG tablet  TAKE 1 TABLET(40 MG) BY MOUTH TWICE DAILY  . KLOR-CON M10 10 MEQ tablet TAKE 1 TABLET(10 MEQ) BY MOUTH DAILY  . metFORMIN (GLUCOPHAGE) 500 MG tablet Take 500 mg by mouth 2 (two) times daily.  . pravastatin (PRAVACHOL) 10 MG tablet Take 10 mg by mouth at bedtime.     Allergies:   Patient has no known allergies.   Social History   Socioeconomic History  . Marital status: Married    Spouse name: Not on file  . Number of children: 4  . Years of education: Not on file  . Highest education level: Not on file  Occupational History  . Not on file  Tobacco Use  . Smoking status: Former Research scientist (life sciences)  . Smokeless tobacco: Never Used  Substance and Sexual Activity  . Alcohol use: No  . Drug use: No  . Sexual activity: Yes  Other Topics Concern  . Not on file  Social History Narrative  . Not on file   Social Determinants of Health   Financial Resource Strain:   . Difficulty of Paying Living Expenses:   Food Insecurity:   . Worried About Charity fundraiser in the Last Year:   . Arboriculturist in the Last Year:   Transportation Needs:   . Lack  of Transportation (Medical):   Marland Kitchen Lack of Transportation (Non-Medical):   Physical Activity:   . Days of Exercise per Week:   . Minutes of Exercise per Session:   Stress:   . Feeling of Stress :   Social Connections:   . Frequency of Communication with Friends and Family:   . Frequency of Social Gatherings with Friends and Family:   . Attends Religious Services:   . Active Member of Clubs or Organizations:   . Attends Archivist Meetings:   Marland Kitchen Marital Status:      Family History: The patient's family history includes Cancer in his sister; Coronary artery disease in his father; Diabetes in his sister; Heart attack in his brother; Stroke in his father. ROS:   Please see the history of present illness.    All 14 point review of systems negative except as described per history of present illness  EKGs/Labs/Other Studies Reviewed:        Recent Labs: 12/31/2019: ALT 22 01/26/2020: BUN 19; Creatinine, Ser 1.09; NT-Pro BNP 16; Potassium 4.2; Sodium 139  Recent Lipid Panel    Component Value Date/Time   CHOL 171 09/28/2011 0500   TRIG 117 09/28/2011 0500   HDL 44 09/28/2011 0500   CHOLHDL 3.9 09/28/2011 0500   VLDL 23 09/28/2011 0500   LDLCALC 104 (H) 09/28/2011 0500    Physical Exam:    VS:  BP 124/74   Pulse 92   Ht 5\' 7"  (1.702 m)   Wt 272 lb (123.4 kg)   SpO2 96%   BMI 42.60 kg/m     Wt Readings from Last 3 Encounters:  03/24/20 272 lb (123.4 kg)  12/31/19 273 lb (123.8 kg)  09/26/19 270 lb (122.5 kg)     GEN:  Well nourished, well developed in no acute distress HEENT: Normal NECK: No JVD; No carotid bruits LYMPHATICS: No lymphadenopathy CARDIAC: RRR, no murmurs, no rubs, no gallops RESPIRATORY:  Clear to auscultation without rales, wheezing or rhonchi  ABDOMEN: Soft, non-tender, non-distended MUSCULOSKELETAL:  No edema; No deformity  SKIN: Warm and dry LOWER EXTREMITIES: no swelling NEUROLOGIC:  Alert and oriented x 3 PSYCHIATRIC:  Normal affect   ASSESSMENT:    1. Essential hypertension   2. Chronic diastolic congestive heart failure ([place])   3. Obstructive sleep apnea syndrome   4. Obesity, Class III, BMI 40-49.9 (morbid obesity) ([place])   5. Dyspnea on exertion    PLAN:    In order of problems listed above:  1. Essential hypertension blood pressure well controlled today 124/74 I will continue present management. 2. Chronic diastolic congestive heart failure.  Swelling of lower extremities always there.  I will recheck his echocardiogram to recheck a left ventricle ejection fraction. 3. Obstructive sleep apnea he does have CPAP mask and use it but admits not on a regular basis. 4. Dyslipidemia: I reviewed laboratory test done by his primary care physician in [place] I see LDL of 68 and HDL of 40 which is from November 2020.  This is acceptable cholesterol profile.  We will continue  present management. 5. Obesity obviously a problem he understand he is trying to work on that. 6. Dyspnea on exertion: Multifactorial.  Weight play significant role here will do echocardiogram to assess ejection fraction   Medication Adjustments/Labs and Tests Ordered: Current medicines are reviewed at length with the patient today.  Concerns regarding medicines are outlined above.  No orders of the defined types were placed in this encounter.  Medication  changes: No orders of the defined types were placed in this encounter.   Signed, Park Liter, MD, [facility] 03/24/2020 3:40 PM    [place]

## 2020-03-24 NOTE — Patient Instructions (Signed)
Medication Instructions:  °Your physician recommends that you continue on your current medications as directed. Please refer to the Current Medication list given to you today. ° °*If you need a refill on your cardiac medications before your next appointment, please call your pharmacy* ° ° °Lab Work: °None. ° °If you have labs (blood work) drawn today and your tests are completely normal, you will receive your results only by: °• MyChart Message (if you have MyChart) OR °• A paper copy in the mail °If you have any lab test that is abnormal or we need to change your treatment, we will call you to review the results. ° ° °Testing/Procedures: °Your physician has requested that you have an echocardiogram. Echocardiography is a painless test that uses sound waves to create images of your heart. It provides your doctor with information about the size and shape of your heart and how well your heart’s chambers and valves are working. This procedure takes approximately one hour. There are no restrictions for this procedure. ° ° ° ° °Follow-Up: °At [facility], you and your health needs are our priority.  As part of our continuing mission to provide you with exceptional heart care, we have created designated Provider Care Teams.  These Care Teams include your primary Cardiologist (physician) and Advanced Practice Providers (APPs -  Physician Assistants and Nurse Practitioners) who all work together to provide you with the care you need, when you need it. ° °We recommend signing up for the patient portal called "MyChart".  Sign up information is provided on this After Visit Summary.  MyChart is used to connect with patients for Virtual Visits (Telemedicine).  Patients are able to view lab/test results, encounter notes, upcoming appointments, etc.  Non-urgent messages can be sent to your provider as well.   °To learn more about what you can do with MyChart, go to [email or web address].   ° °Your next appointment:   °6  month(s) ° °The format for your next appointment:   °In Person ° °Provider:   °Robert Krasowski, MD ° ° °Other Instructions ° ° °Echocardiogram °An echocardiogram is a procedure that uses painless sound waves (ultrasound) to produce an image of the heart. Images from an echocardiogram can provide important information about: °· Signs of coronary artery disease (CAD). °· Aneurysm detection. An aneurysm is a weak or damaged part of an artery wall that bulges out from the normal force of blood pumping through the body. °· Heart size and shape. Changes in the size or shape of the heart can be associated with certain conditions, including heart failure, aneurysm, and CAD. °· Heart muscle function. °· Heart valve function. °· Signs of a past heart attack. °· Fluid buildup around the heart. °· Thickening of the heart muscle. °· A tumor or infectious growth around the heart valves. °Tell a health care provider about: °· Any allergies you have. °· All medicines you are taking, including vitamins, herbs, eye drops, creams, and over-the-counter medicines. °· Any blood disorders you have. °· Any surgeries you have had. °· Any medical conditions you have. °· Whether you are pregnant or may be pregnant. °What are the risks? °Generally, this is a safe procedure. However, problems may occur, including: °· Allergic reaction to dye (contrast) that may be used during the procedure. °What happens before the procedure? °No specific preparation is needed. You may eat and drink normally. °What happens during the procedure? ° °· An IV tube may be inserted into one of your veins. °· You may   receive contrast through this tube. A contrast is an injection that improves the quality of the pictures from your heart. °· A gel will be applied to your chest. °· A wand-like tool (transducer) will be moved over your chest. The gel will help to transmit the sound waves from the transducer. °· The sound waves will harmlessly bounce off of your heart to  allow the heart images to be captured in real-time motion. The images will be recorded on a computer. °The procedure may vary among health care providers and hospitals. °What happens after the procedure? °· You may return to your normal, everyday life, including diet, activities, and medicines, unless your health care provider tells you not to do that. °Summary °· An echocardiogram is a procedure that uses painless sound waves (ultrasound) to produce an image of the heart. °· Images from an echocardiogram can provide important information about the size and shape of your heart, heart muscle function, heart valve function, and fluid buildup around your heart. °· You do not need to do anything to prepare before this procedure. You may eat and drink normally. °· After the echocardiogram is completed, you may return to your normal, everyday life, unless your health care provider tells you not to do that. °This information is not intended to replace advice given to you by your health care provider. Make sure you discuss any questions you have with your health care provider. °Document Revised: 02/13/2019 Document Reviewed: 11/25/2016 °Elsevier Patient Education © 2020 [employer] ° ° °

## 2020-04-05 DIAGNOSIS — I10 Essential (primary) hypertension: Secondary | ICD-10-CM | POA: Diagnosis not present

## 2020-04-05 DIAGNOSIS — E7800 Pure hypercholesterolemia, unspecified: Secondary | ICD-10-CM | POA: Diagnosis not present

## 2020-04-05 DIAGNOSIS — M1990 Unspecified osteoarthritis, unspecified site: Secondary | ICD-10-CM | POA: Diagnosis not present

## 2020-04-05 DIAGNOSIS — E1165 Type 2 diabetes mellitus with hyperglycemia: Secondary | ICD-10-CM | POA: Diagnosis not present

## 2020-04-26 ENCOUNTER — Other Ambulatory Visit: Payer: Self-pay

## 2020-04-26 ENCOUNTER — Ambulatory Visit (HOSPITAL_BASED_OUTPATIENT_CLINIC_OR_DEPARTMENT_OTHER)
Admission: RE | Admit: 2020-04-26 | Discharge: 2020-04-26 | Disposition: A | Discharge status: Home / Self Care | Payer: Medicare Other | Source: Ambulatory Visit | Attending: Cardiology | Admitting: Cardiology

## 2020-04-26 DIAGNOSIS — R0600 Dyspnea, unspecified: Secondary | ICD-10-CM | POA: Diagnosis not present

## 2020-04-26 NOTE — Progress Notes (Signed)
  Echocardiogram 2D Echocardiogram has been performed.  Darlina Sicilian M 04/26/2020, 3:47 PM

## 2020-05-04 DIAGNOSIS — D485 Neoplasm of uncertain behavior of skin: Secondary | ICD-10-CM | POA: Diagnosis not present

## 2020-05-04 DIAGNOSIS — L570 Actinic keratosis: Secondary | ICD-10-CM | POA: Diagnosis not present

## 2020-05-04 DIAGNOSIS — D2239 Melanocytic nevi of other parts of face: Secondary | ICD-10-CM | POA: Diagnosis not present

## 2020-05-06 ENCOUNTER — Other Ambulatory Visit: Payer: Self-pay | Admitting: Cardiology

## 2020-05-12 DIAGNOSIS — B029 Zoster without complications: Secondary | ICD-10-CM | POA: Diagnosis not present

## 2020-06-24 DIAGNOSIS — C44329 Squamous cell carcinoma of skin of other parts of face: Secondary | ICD-10-CM | POA: Diagnosis not present

## 2020-06-24 DIAGNOSIS — B0223 Postherpetic polyneuropathy: Secondary | ICD-10-CM | POA: Diagnosis not present

## 2020-06-24 DIAGNOSIS — B029 Zoster without complications: Secondary | ICD-10-CM | POA: Diagnosis not present

## 2020-07-01 ENCOUNTER — Other Ambulatory Visit: Payer: Self-pay | Admitting: Cardiology

## 2020-07-02 ENCOUNTER — Telehealth: Payer: Self-pay | Admitting: Cardiology

## 2020-07-02 MED ORDER — FUROSEMIDE 40 MG PO TABS: ORAL_TABLET | ORAL | 3 refills | Status: DC

## 2020-07-02 NOTE — Telephone Encounter (Signed)
*  STAT* If patient is at the pharmacy, call can be transferred to refill team.   1. Which medications need to be refilled? (please list name of each medication and dose if known) furosemide (LASIX) 40 MG tablet; amLODipine (NORVASC) 5 MG tablet   2. Which pharmacy/location (including street and city if local pharmacy) is medication to be sent to? [facility] [phone] - [place], [address] AT [address]   3. Do they need a 30 day or 90 day supply? [place]

## 2020-07-02 NOTE — Telephone Encounter (Signed)
Refill sent in per request.  

## 2020-08-15 DIAGNOSIS — M549 Dorsalgia, unspecified: Secondary | ICD-10-CM | POA: Diagnosis not present

## 2020-09-24 ENCOUNTER — Other Ambulatory Visit: Payer: Self-pay

## 2020-09-24 DIAGNOSIS — R0602 Shortness of breath: Secondary | ICD-10-CM | POA: Insufficient documentation

## 2020-09-24 DIAGNOSIS — I639 Cerebral infarction, unspecified: Secondary | ICD-10-CM | POA: Insufficient documentation

## 2020-09-24 DIAGNOSIS — I679 Cerebrovascular disease, unspecified: Secondary | ICD-10-CM | POA: Insufficient documentation

## 2020-09-27 ENCOUNTER — Ambulatory Visit: Payer: Medicare Other | Admitting: Cardiology

## 2020-09-27 ENCOUNTER — Other Ambulatory Visit: Payer: Self-pay

## 2020-09-27 ENCOUNTER — Encounter: Payer: Self-pay | Admitting: Cardiology

## 2020-09-27 VITALS — BP 144/64 | HR 92 | Ht 67.0 in | Wt 284.0 lb

## 2020-09-27 DIAGNOSIS — R0600 Dyspnea, unspecified: Secondary | ICD-10-CM | POA: Diagnosis not present

## 2020-09-27 DIAGNOSIS — I10 Essential (primary) hypertension: Secondary | ICD-10-CM

## 2020-09-27 DIAGNOSIS — I5032 Chronic diastolic (congestive) heart failure: Secondary | ICD-10-CM

## 2020-09-27 DIAGNOSIS — R7303 Prediabetes: Secondary | ICD-10-CM

## 2020-09-27 DIAGNOSIS — R0609 Other forms of dyspnea: Secondary | ICD-10-CM

## 2020-09-27 MED ORDER — FUROSEMIDE 40 MG PO TABS: ORAL_TABLET | ORAL | 3 refills | Status: DC

## 2020-09-27 MED ORDER — AMLODIPINE BESYLATE 5 MG PO TABS: ORAL_TABLET | ORAL | 3 refills | Status: DC

## 2020-09-27 NOTE — Progress Notes (Signed)
Cardiology Office Note:    Date:  09/27/2020   ID:  Samuel Garza, DOB [birth year], MRN [id number]  PCP:  Angelina Sheriff, MD  Cardiologist:  Jenne Campus, MD    Referring MD: Angelina Sheriff, MD   Chief Complaint  Patient presents with  . Follow-up  I am doing fine  History of Present Illness:    Samuel Garza is a 86 y.o. male with past medical history significant for diastolic congestive heart failure, morbid obesity, essential hypertension, prediabetes.  Comes today to my office for follow-up.  He still works he used to go on Saturday to a [employer] however he gave up on that.  He gained few pounds since I seen him last time however last time he had a long discussion about the fact that he did lose some weight.  Denies have any chest pain tightness squeezing pressure burning chest.  In the summer he got shingles and that bothers him a lot recover from it does not have much pain right now.  Past Medical History:  Diagnosis Date  . Angina   . Chest pain at rest   . Chronic diastolic congestive heart failure ([place]) 10/31/2016  . Dyspnea on exertion 10/31/2016  . Hypertension   . Obesities, morbid ([place])   . Obesity, Class III, BMI 40-49.9 (morbid obesity) ([place]) 09/27/2011  . Prediabetes 09/26/2019  . Shortness of breath   . Sleep apnea   . Stroke [facility])     Past Surgical History:  Procedure Laterality Date  . APPENDECTOMY  1947   age 26  . CARDIAC CATHETERIZATION    . GALLBLADDER SURGERY  12/2010   age 74  . Left knee arthroscopy  05/2009   age 4    Current Medications: Current Meds  Medication Sig  . amLODipine (NORVASC) 5 MG tablet TAKE 1 TABLET(5 MG) BY MOUTH DAILY  . aspirin EC 81 MG tablet Take 81 mg by mouth daily.    . furosemide (LASIX) 40 MG tablet TAKE 1 TABLET(40 MG) BY MOUTH TWICE DAILY  . gabapentin (NEURONTIN) 300 MG capsule Take 300 mg by mouth 3 (three) times daily.  Marland Kitchen KLOR-CON M10 10 MEQ tablet TAKE 1 TABLET(10 MEQ) BY MOUTH DAILY    . metFORMIN (GLUCOPHAGE) 500 MG tablet Take 500 mg by mouth 2 (two) times daily.  . pravastatin (PRAVACHOL) 10 MG tablet Take 10 mg by mouth at bedtime.     Allergies:   Patient has no known allergies.   Social History   Socioeconomic History  . Marital status: Married    Spouse name: Not on file  . Number of children: 4  . Years of education: Not on file  . Highest education level: Not on file  Occupational History  . Not on file  Tobacco Use  . Smoking status: Former Research scientist (life sciences)  . Smokeless tobacco: Never Used  Vaping Use  . Vaping Use: Never used  Substance and Sexual Activity  . Alcohol use: No  . Drug use: No  . Sexual activity: Yes  Other Topics Concern  . Not on file  Social History Narrative  . Not on file   Social Determinants of Health   Financial Resource Strain:   . Difficulty of Paying Living Expenses: Not on file  Food Insecurity:   . Worried About Charity fundraiser in the Last Year: Not on file  . Ran Out of Food in the Last Year: Not on file  Transportation Needs:   .  Lack of Transportation (Medical): Not on file  . Lack of Transportation (Non-Medical): Not on file  Physical Activity:   . Days of Exercise per Week: Not on file  . Minutes of Exercise per Session: Not on file  Stress:   . Feeling of Stress : Not on file  Social Connections:   . Frequency of Communication with Friends and Family: Not on file  . Frequency of Social Gatherings with Friends and Family: Not on file  . Attends Religious Services: Not on file  . Active Member of Clubs or Organizations: Not on file  . Attends Archivist Meetings: Not on file  . Marital Status: Not on file     Family History: The patient's family history includes Cancer in his sister; Coronary artery disease in his father; Diabetes in his sister; Heart attack in his brother; Stroke in his father. ROS:   Please see the history of present illness.    All 14 point review of systems negative except  as described per history of present illness  EKGs/Labs/Other Studies Reviewed:      Recent Labs: 12/31/2019: ALT 22 01/26/2020: BUN 19; Creatinine, Ser 1.09; NT-Pro BNP 16; Potassium 4.2; Sodium 139  Recent Lipid Panel    Component Value Date/Time   CHOL 171 09/28/2011 0500   TRIG 117 09/28/2011 0500   HDL 44 09/28/2011 0500   CHOLHDL 3.9 09/28/2011 0500   VLDL 23 09/28/2011 0500   LDLCALC 104 (H) 09/28/2011 0500    Physical Exam:    VS:  BP (!) 144/64 (BP Location: Right Arm, Patient Position: Sitting, Cuff Size: Large)   Pulse 92   Ht 5\' 7"  (1.702 m)   Wt 284 lb (128.8 kg)   SpO2 97%   BMI 44.48 kg/m     Wt Readings from Last 3 Encounters:  09/27/20 284 lb (128.8 kg)  03/24/20 272 lb (123.4 kg)  12/31/19 273 lb (123.8 kg)     GEN:  Well nourished, well developed in no acute distress HEENT: Normal NECK: No JVD; No carotid bruits LYMPHATICS: No lymphadenopathy CARDIAC: RRR, no murmurs, no rubs, no gallops RESPIRATORY:  Clear to auscultation without rales, wheezing or rhonchi  ABDOMEN: Soft, non-tender, non-distended MUSCULOSKELETAL:  No edema; No deformity  SKIN: Warm and dry LOWER EXTREMITIES: no swelling NEUROLOGIC:  Alert and oriented x 3 PSYCHIATRIC:  Normal affect   ASSESSMENT:    1. Chronic diastolic congestive heart failure ([place])   2. Dyspnea on exertion   3. Prediabetes   4. Obesity, Class III, BMI 40-49.9 (morbid obesity) ([place])   5. Primary hypertension    PLAN:    In order of problems listed above:  1. Chronic diastolic congestive heart failure minimal swelling of lower extremities as usual he said he is fine with it.  Does not have much shortness of breath.  Will refill on his medications. 2. Dyspnea exertion chronic weight play significant role here. 3. Essential hypertension blood pressure elevated today he said he check it at home is always good.  We will continue monitoring. 4. Prediabetes followed by internal medicine team.  His last  hemoglobin A1c is 6.8 which is from Mar 11, 2020 this is from [facility], that was done by primary care physician. 5. Dyslipidemia I have last fasting lipid profile from November of last year more than a year ago with LDL of 68 HDL 40 we will do the test.   Medication Adjustments/Labs and Tests Ordered: Current medicines are reviewed at length with  the patient today.  Concerns regarding medicines are outlined above.  No orders of the defined types were placed in this encounter.  Medication changes: No orders of the defined types were placed in this encounter.   Signed, Park Liter, MD, [facility] 09/27/2020 3:30 PM    [place]

## 2020-09-27 NOTE — Addendum Note (Signed)
Addended by: Senaida Ores on: 09/27/2020 03:35 PM   Modules accepted: Orders

## 2020-09-27 NOTE — Patient Instructions (Signed)

## 2020-09-28 LAB — LIPID PANEL
Chol/HDL Ratio: 2.8 ratio (ref 0.0–5.0)
Cholesterol, Total: 122 mg/dL (ref 100–199)
HDL: 43 mg/dL (ref >39)
LDL Chol Calc (NIH): 59 mg/dL (ref 0–99)
Triglycerides: 108 mg/dL (ref 0–149)
VLDL Cholesterol Cal: 20 mg/dL (ref 5–40)

## 2020-10-11 DIAGNOSIS — M65872 Other synovitis and tenosynovitis, left ankle and foot: Secondary | ICD-10-CM | POA: Diagnosis not present

## 2020-10-11 DIAGNOSIS — M25572 Pain in left ankle and joints of left foot: Secondary | ICD-10-CM | POA: Diagnosis not present

## 2020-10-11 DIAGNOSIS — M79672 Pain in left foot: Secondary | ICD-10-CM | POA: Diagnosis not present

## 2020-10-19 DIAGNOSIS — E7800 Pure hypercholesterolemia, unspecified: Secondary | ICD-10-CM | POA: Diagnosis not present

## 2020-10-19 DIAGNOSIS — I10 Essential (primary) hypertension: Secondary | ICD-10-CM | POA: Diagnosis not present

## 2020-10-19 DIAGNOSIS — E1143 Type 2 diabetes mellitus with diabetic autonomic (poly)neuropathy: Secondary | ICD-10-CM | POA: Diagnosis not present

## 2020-10-27 DIAGNOSIS — M722 Plantar fascial fibromatosis: Secondary | ICD-10-CM | POA: Diagnosis not present

## 2020-11-17 DIAGNOSIS — L570 Actinic keratosis: Secondary | ICD-10-CM | POA: Diagnosis not present

## 2020-12-01 DIAGNOSIS — G4733 Obstructive sleep apnea (adult) (pediatric): Secondary | ICD-10-CM | POA: Diagnosis not present

## 2021-01-21 ENCOUNTER — Emergency Department (HOSPITAL_COMMUNITY)
Admission: EM | Admit: 2021-01-21 | Discharge: 2021-01-21 | Disposition: A | Discharge status: Home / Self Care | Payer: Medicare Other | Attending: Emergency Medicine | Admitting: Emergency Medicine

## 2021-01-21 ENCOUNTER — Encounter (HOSPITAL_COMMUNITY): Payer: Self-pay | Admitting: *Deleted

## 2021-01-21 ENCOUNTER — Other Ambulatory Visit: Payer: Self-pay

## 2021-01-21 DIAGNOSIS — R0902 Hypoxemia: Secondary | ICD-10-CM | POA: Diagnosis not present

## 2021-01-21 DIAGNOSIS — R112 Nausea with vomiting, unspecified: Secondary | ICD-10-CM | POA: Insufficient documentation

## 2021-01-21 DIAGNOSIS — R404 Transient alteration of awareness: Secondary | ICD-10-CM | POA: Diagnosis not present

## 2021-01-21 DIAGNOSIS — R6889 Other general symptoms and signs: Secondary | ICD-10-CM | POA: Diagnosis not present

## 2021-01-21 DIAGNOSIS — Z5321 Procedure and treatment not carried out due to patient leaving prior to being seen by health care provider: Secondary | ICD-10-CM | POA: Diagnosis not present

## 2021-01-21 DIAGNOSIS — R42 Dizziness and giddiness: Secondary | ICD-10-CM | POA: Diagnosis not present

## 2021-01-21 DIAGNOSIS — I499 Cardiac arrhythmia, unspecified: Secondary | ICD-10-CM | POA: Diagnosis not present

## 2021-01-21 DIAGNOSIS — Z743 Need for continuous supervision: Secondary | ICD-10-CM | POA: Diagnosis not present

## 2021-01-21 NOTE — ED Triage Notes (Signed)
Pt has 20g in right hand and had 4mg  IV zofran from ems

## 2021-01-21 NOTE — ED Triage Notes (Signed)
Pt with hx of vertigo was at work when he began feeling dizzy and nauseated, vomited x1.  No focal weakness, no pain.  VSS

## 2021-01-21 NOTE — ED Notes (Signed)
Pt LWBS. Pt was encouraged to stay.

## 2021-01-24 DIAGNOSIS — H6121 Impacted cerumen, right ear: Secondary | ICD-10-CM | POA: Diagnosis not present

## 2021-01-24 DIAGNOSIS — Z9119 Patient's noncompliance with other medical treatment and regimen: Secondary | ICD-10-CM | POA: Diagnosis not present

## 2021-01-24 DIAGNOSIS — I10 Essential (primary) hypertension: Secondary | ICD-10-CM | POA: Diagnosis not present

## 2021-01-24 DIAGNOSIS — H8110 Benign paroxysmal vertigo, unspecified ear: Secondary | ICD-10-CM | POA: Diagnosis not present

## 2021-02-03 ENCOUNTER — Other Ambulatory Visit: Payer: Self-pay | Admitting: Cardiology

## 2021-03-02 DIAGNOSIS — D485 Neoplasm of uncertain behavior of skin: Secondary | ICD-10-CM | POA: Diagnosis not present

## 2021-03-02 DIAGNOSIS — D1801 Hemangioma of skin and subcutaneous tissue: Secondary | ICD-10-CM | POA: Diagnosis not present

## 2021-03-11 DIAGNOSIS — L03031 Cellulitis of right toe: Secondary | ICD-10-CM | POA: Diagnosis not present

## 2021-03-16 DIAGNOSIS — C4442 Squamous cell carcinoma of skin of scalp and neck: Secondary | ICD-10-CM | POA: Diagnosis not present

## 2021-04-29 DIAGNOSIS — E1143 Type 2 diabetes mellitus with diabetic autonomic (poly)neuropathy: Secondary | ICD-10-CM | POA: Diagnosis not present

## 2021-05-13 DIAGNOSIS — L600 Ingrowing nail: Secondary | ICD-10-CM | POA: Diagnosis not present

## 2021-07-26 DIAGNOSIS — L570 Actinic keratosis: Secondary | ICD-10-CM | POA: Diagnosis not present

## 2021-07-26 DIAGNOSIS — D485 Neoplasm of uncertain behavior of skin: Secondary | ICD-10-CM | POA: Diagnosis not present

## 2021-07-26 DIAGNOSIS — D2239 Melanocytic nevi of other parts of face: Secondary | ICD-10-CM | POA: Diagnosis not present

## 2021-08-05 DIAGNOSIS — G4733 Obstructive sleep apnea (adult) (pediatric): Secondary | ICD-10-CM | POA: Diagnosis not present

## 2021-08-05 DIAGNOSIS — E1143 Type 2 diabetes mellitus with diabetic autonomic (poly)neuropathy: Secondary | ICD-10-CM | POA: Diagnosis not present

## 2021-08-05 DIAGNOSIS — Z23 Encounter for immunization: Secondary | ICD-10-CM | POA: Diagnosis not present

## 2021-08-05 DIAGNOSIS — I10 Essential (primary) hypertension: Secondary | ICD-10-CM | POA: Diagnosis not present

## 2021-08-05 DIAGNOSIS — Z0000 Encounter for general adult medical examination without abnormal findings: Secondary | ICD-10-CM | POA: Diagnosis not present

## 2021-08-05 DIAGNOSIS — I5032 Chronic diastolic (congestive) heart failure: Secondary | ICD-10-CM | POA: Diagnosis not present

## 2021-09-19 ENCOUNTER — Other Ambulatory Visit: Payer: Self-pay | Admitting: Cardiology

## 2021-10-24 ENCOUNTER — Other Ambulatory Visit: Payer: Self-pay | Admitting: Cardiology

## 2021-11-10 DIAGNOSIS — E1165 Type 2 diabetes mellitus with hyperglycemia: Secondary | ICD-10-CM | POA: Diagnosis not present

## 2021-11-17 DIAGNOSIS — G4733 Obstructive sleep apnea (adult) (pediatric): Secondary | ICD-10-CM | POA: Diagnosis not present

## 2021-12-07 DIAGNOSIS — E119 Type 2 diabetes mellitus without complications: Secondary | ICD-10-CM | POA: Diagnosis not present

## 2021-12-07 DIAGNOSIS — H26492 Other secondary cataract, left eye: Secondary | ICD-10-CM | POA: Diagnosis not present

## 2021-12-07 DIAGNOSIS — Z961 Presence of intraocular lens: Secondary | ICD-10-CM | POA: Diagnosis not present

## 2021-12-07 DIAGNOSIS — Z7984 Long term (current) use of oral hypoglycemic drugs: Secondary | ICD-10-CM | POA: Diagnosis not present

## 2021-12-07 DIAGNOSIS — Z9841 Cataract extraction status, right eye: Secondary | ICD-10-CM | POA: Diagnosis not present

## 2022-01-04 DIAGNOSIS — L570 Actinic keratosis: Secondary | ICD-10-CM | POA: Diagnosis not present

## 2022-01-11 ENCOUNTER — Other Ambulatory Visit: Payer: Self-pay | Admitting: Cardiology

## 2022-01-23 ENCOUNTER — Other Ambulatory Visit: Payer: Self-pay | Admitting: Cardiology

## 2022-02-20 ENCOUNTER — Other Ambulatory Visit: Payer: Self-pay | Admitting: Cardiology

## 2022-02-20 DIAGNOSIS — H6123 Impacted cerumen, bilateral: Secondary | ICD-10-CM | POA: Diagnosis not present

## 2022-02-20 DIAGNOSIS — E1165 Type 2 diabetes mellitus with hyperglycemia: Secondary | ICD-10-CM | POA: Diagnosis not present

## 2022-02-20 DIAGNOSIS — I10 Essential (primary) hypertension: Secondary | ICD-10-CM | POA: Diagnosis not present

## 2022-03-12 ENCOUNTER — Other Ambulatory Visit: Payer: Self-pay | Admitting: Cardiology

## 2022-03-13 NOTE — Telephone Encounter (Signed)
Furosemide 40 mg # 30 only with message patient needs appointment for future refills / 2nd attempt ?Sent to [employer] Dr Tia Alert ?

## 2022-04-06 DIAGNOSIS — L570 Actinic keratosis: Secondary | ICD-10-CM | POA: Diagnosis not present

## 2022-04-06 DIAGNOSIS — D485 Neoplasm of uncertain behavior of skin: Secondary | ICD-10-CM | POA: Diagnosis not present

## 2022-04-06 DIAGNOSIS — D2239 Melanocytic nevi of other parts of face: Secondary | ICD-10-CM | POA: Diagnosis not present

## 2022-04-25 DIAGNOSIS — D0439 Carcinoma in situ of skin of other parts of face: Secondary | ICD-10-CM | POA: Diagnosis not present

## 2022-04-25 DIAGNOSIS — L570 Actinic keratosis: Secondary | ICD-10-CM | POA: Diagnosis not present

## 2022-05-24 DIAGNOSIS — E119 Type 2 diabetes mellitus without complications: Secondary | ICD-10-CM | POA: Diagnosis not present

## 2022-06-05 DIAGNOSIS — I5032 Chronic diastolic (congestive) heart failure: Secondary | ICD-10-CM | POA: Diagnosis not present

## 2022-06-05 DIAGNOSIS — L97909 Non-pressure chronic ulcer of unspecified part of unspecified lower leg with unspecified severity: Secondary | ICD-10-CM | POA: Diagnosis not present

## 2022-06-05 DIAGNOSIS — I83009 Varicose veins of unspecified lower extremity with ulcer of unspecified site: Secondary | ICD-10-CM | POA: Diagnosis not present

## 2022-06-12 DIAGNOSIS — I872 Venous insufficiency (chronic) (peripheral): Secondary | ICD-10-CM | POA: Diagnosis not present

## 2022-06-12 DIAGNOSIS — I83009 Varicose veins of unspecified lower extremity with ulcer of unspecified site: Secondary | ICD-10-CM | POA: Diagnosis not present

## 2022-06-12 DIAGNOSIS — L97909 Non-pressure chronic ulcer of unspecified part of unspecified lower leg with unspecified severity: Secondary | ICD-10-CM | POA: Diagnosis not present

## 2022-07-12 DIAGNOSIS — D0439 Carcinoma in situ of skin of other parts of face: Secondary | ICD-10-CM | POA: Diagnosis not present

## 2022-07-12 DIAGNOSIS — D485 Neoplasm of uncertain behavior of skin: Secondary | ICD-10-CM | POA: Diagnosis not present

## 2022-07-12 DIAGNOSIS — L570 Actinic keratosis: Secondary | ICD-10-CM | POA: Diagnosis not present

## 2022-07-14 DIAGNOSIS — I872 Venous insufficiency (chronic) (peripheral): Secondary | ICD-10-CM | POA: Diagnosis not present

## 2022-08-08 DIAGNOSIS — I499 Cardiac arrhythmia, unspecified: Secondary | ICD-10-CM | POA: Diagnosis not present

## 2022-08-08 DIAGNOSIS — C441121 Basal cell carcinoma of skin of right upper eyelid, including canthus: Secondary | ICD-10-CM | POA: Diagnosis not present

## 2022-08-08 DIAGNOSIS — I4510 Unspecified right bundle-branch block: Secondary | ICD-10-CM | POA: Diagnosis not present

## 2022-08-08 DIAGNOSIS — R6889 Other general symptoms and signs: Secondary | ICD-10-CM | POA: Diagnosis not present

## 2022-08-08 DIAGNOSIS — R110 Nausea: Secondary | ICD-10-CM | POA: Diagnosis not present

## 2022-08-09 DIAGNOSIS — E119 Type 2 diabetes mellitus without complications: Secondary | ICD-10-CM | POA: Diagnosis not present

## 2022-08-09 DIAGNOSIS — I110 Hypertensive heart disease with heart failure: Secondary | ICD-10-CM | POA: Diagnosis not present

## 2022-08-09 DIAGNOSIS — I509 Heart failure, unspecified: Secondary | ICD-10-CM | POA: Diagnosis not present

## 2022-08-09 DIAGNOSIS — Z1152 Encounter for screening for COVID-19: Secondary | ICD-10-CM | POA: Diagnosis not present

## 2022-08-09 DIAGNOSIS — Z7952 Long term (current) use of systemic steroids: Secondary | ICD-10-CM | POA: Diagnosis not present

## 2022-08-09 DIAGNOSIS — Z85828 Personal history of other malignant neoplasm of skin: Secondary | ICD-10-CM | POA: Diagnosis not present

## 2022-08-09 DIAGNOSIS — J9601 Acute respiratory failure with hypoxia: Secondary | ICD-10-CM | POA: Diagnosis not present

## 2022-08-09 DIAGNOSIS — I4510 Unspecified right bundle-branch block: Secondary | ICD-10-CM | POA: Diagnosis not present

## 2022-08-09 DIAGNOSIS — Z23 Encounter for immunization: Secondary | ICD-10-CM | POA: Diagnosis not present

## 2022-08-09 DIAGNOSIS — L03116 Cellulitis of left lower limb: Secondary | ICD-10-CM | POA: Diagnosis not present

## 2022-08-09 DIAGNOSIS — Z792 Long term (current) use of antibiotics: Secondary | ICD-10-CM | POA: Diagnosis not present

## 2022-08-09 DIAGNOSIS — Z79899 Other long term (current) drug therapy: Secondary | ICD-10-CM | POA: Diagnosis not present

## 2022-08-09 DIAGNOSIS — R112 Nausea with vomiting, unspecified: Secondary | ICD-10-CM | POA: Diagnosis not present

## 2022-08-09 DIAGNOSIS — I10 Essential (primary) hypertension: Secondary | ICD-10-CM | POA: Diagnosis not present

## 2022-08-09 DIAGNOSIS — R0602 Shortness of breath: Secondary | ICD-10-CM | POA: Diagnosis not present

## 2022-08-09 DIAGNOSIS — I4892 Unspecified atrial flutter: Secondary | ICD-10-CM | POA: Diagnosis not present

## 2022-08-09 DIAGNOSIS — R590 Localized enlarged lymph nodes: Secondary | ICD-10-CM | POA: Diagnosis not present

## 2022-08-09 DIAGNOSIS — Z87891 Personal history of nicotine dependence: Secondary | ICD-10-CM | POA: Diagnosis not present

## 2022-08-09 DIAGNOSIS — R9431 Abnormal electrocardiogram [ECG] [EKG]: Secondary | ICD-10-CM | POA: Diagnosis not present

## 2022-08-09 DIAGNOSIS — J8410 Pulmonary fibrosis, unspecified: Secondary | ICD-10-CM | POA: Diagnosis not present

## 2022-08-09 DIAGNOSIS — J209 Acute bronchitis, unspecified: Secondary | ICD-10-CM | POA: Diagnosis not present

## 2022-08-09 DIAGNOSIS — Z7982 Long term (current) use of aspirin: Secondary | ICD-10-CM | POA: Diagnosis not present

## 2022-08-09 DIAGNOSIS — K219 Gastro-esophageal reflux disease without esophagitis: Secondary | ICD-10-CM | POA: Diagnosis not present

## 2022-08-11 DIAGNOSIS — I4510 Unspecified right bundle-branch block: Secondary | ICD-10-CM | POA: Diagnosis not present

## 2022-08-31 ENCOUNTER — Other Ambulatory Visit: Payer: Self-pay | Admitting: Cardiology

## 2022-09-10 DIAGNOSIS — J45909 Unspecified asthma, uncomplicated: Secondary | ICD-10-CM | POA: Diagnosis not present

## 2022-09-14 ENCOUNTER — Other Ambulatory Visit: Payer: Self-pay | Admitting: Cardiology

## 2022-10-05 ENCOUNTER — Other Ambulatory Visit: Payer: Self-pay | Admitting: Cardiology

## 2022-10-12 ENCOUNTER — Other Ambulatory Visit: Payer: Self-pay

## 2022-10-12 MED ORDER — FUROSEMIDE 40 MG PO TABS: 40.0000 mg | ORAL_TABLET | Freq: Two times a day (BID) | ORAL | 0 refills | Status: DC

## 2022-10-13 ENCOUNTER — Encounter: Payer: Self-pay | Admitting: Internal Medicine

## 2022-10-13 ENCOUNTER — Ambulatory Visit (INDEPENDENT_AMBULATORY_CARE_PROVIDER_SITE_OTHER): Payer: Medicare Other | Admitting: Internal Medicine

## 2022-10-13 VITALS — BP 140/70 | HR 84 | Temp 97.7°F | Resp 16 | Ht 67.0 in | Wt 295.0 lb

## 2022-10-13 DIAGNOSIS — I10 Essential (primary) hypertension: Secondary | ICD-10-CM | POA: Diagnosis not present

## 2022-10-13 DIAGNOSIS — E1143 Type 2 diabetes mellitus with diabetic autonomic (poly)neuropathy: Secondary | ICD-10-CM | POA: Diagnosis not present

## 2022-10-13 DIAGNOSIS — I872 Venous insufficiency (chronic) (peripheral): Secondary | ICD-10-CM

## 2022-10-13 DIAGNOSIS — I5032 Chronic diastolic (congestive) heart failure: Secondary | ICD-10-CM | POA: Diagnosis not present

## 2022-10-13 DIAGNOSIS — I679 Cerebrovascular disease, unspecified: Secondary | ICD-10-CM | POA: Diagnosis not present

## 2022-10-13 DIAGNOSIS — Z6841 Body Mass Index (BMI) 40.0 and over, adult: Secondary | ICD-10-CM

## 2022-10-13 DIAGNOSIS — Z0000 Encounter for general adult medical examination without abnormal findings: Secondary | ICD-10-CM

## 2022-10-13 DIAGNOSIS — G4733 Obstructive sleep apnea (adult) (pediatric): Secondary | ICD-10-CM | POA: Diagnosis not present

## 2022-10-13 DIAGNOSIS — Z125 Encounter for screening for malignant neoplasm of prostate: Secondary | ICD-10-CM

## 2022-10-13 NOTE — Assessment & Plan Note (Signed)
His BP is borderline.  We will see what his BP is running on his next visit.

## 2022-10-13 NOTE — Assessment & Plan Note (Signed)
His diabetic foot exam was normal.  He has autonomic neuropathy but denies pain or numbness.  We will continue to control his diabetes.

## 2022-10-13 NOTE — Progress Notes (Signed)
Preventive Screening-Counseling & Management     Samuel Garza is a 88 y.o. male who presents for Medicare Annual/Subsequent preventive examination.  His last dilated eye exam was on 12/07/2021 with Dr. Gilford Rile and he states he has no problems with his vision. He has never had a colonoscopy and is currently aged out of this. He had an EGD in 10/2013 which showed candida esophagitis and mild gastritis with a minimal hiatal hernia.   He denies problems with reflux or dysphagia.  The Samuel Garza denies any problems with urination. He does exercise by walking a lot. He does get yearly flu vaccines. He had pneumovax 23 in 2011. He states he has had a Prevnar 13 vaccine. He never had a shingles vaccine. He has had 2 COVID-19 vaccines but no boosters.  He is not interested in the RSV vaccine.  The Samuel Garza denies any depression, anxiety or memory loss. He is on an ASA '81mg'$  daily.    The Samuel Garza was hospitalized this past year from 08/09/2022 until 08/11/2022 where he presented with sudden SOB, nausea and weakness.  He had a sudden urge to vomit and when he did he started having SOB that slowly worsened.  EMS was called and he was found to be hypoxic where they placed him on 3L of O2 via [place] and took him to the ER.  They did a plain film xray which was negative and he was then sent to have a CTA of the chest which showed no evidence of PE but he had a mild prominent right hilar lymph node.  They recommended followup for this in 3-6 months.  He had some changes of prior granulomatous disease noted.  He was admitted with acute hypoxemic respiratory failure with left lower extremity cellulitis and atrial flutter seen on EKG. He was given iv antibiotics and iv solumedrol.  His hypoxia slowly improved and he was weaned to RA.  Ultimately they felt he had acute bronchitis.  He was noted to be in NSR throughout his hospital stay which was confirmed on repeat EKG.  The Samuel Garza was sent home on oral antibiotics and a tapering  course of steriods.  Today, he denies any f/c, chest pain, SOB, wheezing or other problems.  He does need a repeat CT scan next year to followup as above.   Samuel Garza is an 88 yo male who returns for followup of his bilateral lower extremity swelling with chronic venous insuffiency.  He remains on Lasix '40mg'$  BID.  We had him on a short course of metolazone this past year.  I also asked him to use compression hose up to his knees which he has been doing.  His leg swelling is improved.  We also noticed he has some venous stasis ulcers on his left leg but these are now healed.  We saw him at the end of July where I felt he had acute diastolic CHF where he had blistering and weeping of his left lower extremity and swelling 3+ of both legs up to the knee.  I did not have his ECHO at that time and I initially felt he had diastolic dysfunction but his BNP was normal. He was not taking his Lasix correctly where he was taking Lasix '80mg'$  once in late afternoon.  I asked him to start taking Lasix '40mg'$  BID and added metolazone '5mg'$  on M/W/F.  He has a job where he is up all day and walked about 2 miles today where he has swelling back in  both legs.  He has a small venous stasis ulcer on the anterior part of his left leg that is healing.  There is no weeping today. I did receive his ECHO which was not done last year but was done in 04/26/2020 which showed a normal LVEF of 60-65% but he had diastolic dysfunction noted.  Today, he denies any chest pain, SOB, palpitations but he does have DOE but this is unchanged .  He does have a history of chronic diastolic CHF. He had an ECHO in 11/2016 which showed a normal LVEF 60-65% with LVH and diastolic impairment. He has no baseline symptoms of CHF. Specifically denied complaints: orthopnea, edema, and exertional dyspnea.   The Samuel Garza is a 88 year old Caucasian/White male who returns for a follow-up visit for his T2 diabetes. This past year, his diabetes was not controlled and I  added rybelsus '3mg'$  to his regimen however we stopped this due to fatigue and decreased energy levels/malaise with nausea but no vomiting.  I started him on glipizide ER '5mg'$  daily.   Earlier this year, his diabetes was not controlled and we increased his metformin ER from '500mg'$  BID to '1000mg'$  BID.  He did stop his metformin due to him having fatigue.  He states he was diagnosed with diabetes about 6-7 years ago.  He is currently on glipizide ER '5mg'$  daily.   He specifically denies unexplained abdominal pain, nausea or vomiting and documented hypoglycemia. He checks blood sugars at least 2-3 times per week and they tend to range somewhere between 100 and 150 mg/dl fasting.  His last HgBA1c was done in 05/2022 and was 6.8%. He came in fasting today in anticipation of lab work. He has a history of autonomic neuropathy. He denies any diabetic retinopathy, nephropathy or cardiovascular disease.  His last dilated eye exam was on 12/07/2021 and there was no evidence of diabetic retinopathy.  The Samuel Garza is a 88 year old Caucasian/White male who presents for a follow-up evaluation of hypertension.  Since his last visit, he has not had any problems. The Samuel Garza has been checking his blood pressure at home. He states his systolic BP ranges 811-914. The Samuel Garza's current medications include: amlodipine '5mg'$  daily, Lasix '40mg'$  BID,. The Samuel Garza has been tolerating his medications well. The Samuel Garza denies any headache, visual changes, dizziness, lightheadness, chest pain, shortness of breath, weakness/numbness, and edema. He reports there have been no other symptoms noted.   This Samuel Garza also has has a history of chronic diastolic CHF and presents for a regular status visit. He had an ECHO in 11/2016 which showed a normal LVEF 60-65% with LVH and diastolic impairment. The etiology of the CHF is noted in the PMH. His CHF has been in a compensated state on medications as noted in the medication list. He has no baseline symptoms of CHF.  Specifically denied complaints: orthopnea, edema, and exertional dyspnea. Interval history: no healthcare visits with other providers since last seen in this office.   The Samuel Garza also has a history of OSA where he had a sleep study about 15 years ago.  He is currently wearing a CPAP and is compliant with this.  He tells me today that he several years ago that he had a possible stroke where he had left arm weakness about 30 years ago.  He did receive rehab.  He states that he still has some weakness of this arm.    Are there smokers in your home (other than you)? No  Risk Factors Current  exercise habits:  as above   Dietary issues discussed: no   Depression Screen (Note: if answer to either of the following is "Yes", a more complete depression screening is indicated)   Over the past two weeks, have you felt down, depressed or hopeless? No  Over the past two weeks, have you felt little interest or pleasure in doing things? No  Have you lost interest or pleasure in daily life? No  Do you often feel hopeless? No  Do you cry easily over simple problems? No  Activities of Daily Living In your present state of health, do you have any difficulty performing the following activities?:  Driving? No Managing money?  No Feeding yourself? No Getting from bed to chair? No Climbing a flight of stairs? No Preparing food and eating?: No Bathing or showering? No Getting dressed: No Getting to the toilet? No Using the toilet:No Moving around from place to place: No In the past year have you fallen or had a near fall?:No   Are you sexually active?  No  Do you have more than one partner?  No  Hearing Difficulties: Yes Do you often ask people to speak up or repeat themselves? Yes Do you experience ringing or noises in your ears? No Do you have difficulty understanding soft or whispered voices? Yes   Do you feel that you have a problem with memory? Yes  Do you often misplace items? No  Do you  feel safe at home?  Yes  Cognitive Testing  Alert? Yes  Normal Appearance?Yes  Oriented to person? Yes  Place? Yes   Time? Yes  Recall of three objects?  Yes  Can perform simple calculations? Yes  Displays appropriate judgment?Yes  Can read the correct time from a watch face?Yes  Fall Risk Prevention  Any stairs in or around the home? No  If so, are there any without handrails? No  Home free of loose throw rugs in walkways, pet beds, electrical cords, etc? Yes  Adequate lighting in your home to reduce risk of falls? Yes  Use of a cane, walker or w/c? No    Time Up and Go  Was the test performed? Yes .  Length of time to ambulate 10 feet: 12 sec.   Gait slow and steady with assistive device    Advanced Directives have been discussed with the Samuel Garza? Yes   List the Names of Other Physician/Practitioners you currently use: Samuel Garza Care Team: Angelina Sheriff, MD as PCP - General (Family Medicine) Park Liter, MD as PCP - Cardiology (Cardiology)    Past Medical History:  Diagnosis Date   Angina    Cerebrovascular disease    Chest pain at rest    Chronic diastolic congestive heart failure ([place]) 10/31/2016   Diabetes mellitus, type 2 ([place])    Hypertension    Obesities, morbid ([place])    Obesity, Class III, BMI 40-49.9 (morbid obesity) ([place]) 09/27/2011   Prediabetes 09/26/2019   Sleep apnea    Stroke [facility])    Vertigo     Past Surgical History:  Procedure Laterality Date   APPENDECTOMY  1947   age 31   [place] SURGERY  12/2010   age 85   Left knee arthroscopy  05/2009   age 76      Current Medications  Current Outpatient Medications  Medication Sig Dispense Refill   amLODipine (NORVASC) 5 MG tablet TAKE 1 TABLET(5 MG) BY MOUTH DAILY 90 tablet  3   aspirin EC 81 MG tablet Take 81 mg by mouth daily.       furosemide (LASIX) 40 MG tablet Take 1 tablet (40 mg total) by mouth 2 (two) times daily. Needs appointment for  future refill / 2nd attempt 30 tablet 0   gabapentin (NEURONTIN) 300 MG capsule Take 300 mg by mouth 3 (three) times daily.     metFORMIN (GLUCOPHAGE) 500 MG tablet Take 500 mg by mouth 2 (two) times daily.     potassium chloride (KLOR-CON M) 10 MEQ tablet Take 1 tablet (10 mEq total) by mouth daily. Samuel Garza needs appointment for further refills. 3 rd/final attempt 15 tablet 0   pravastatin (PRAVACHOL) 10 MG tablet Take 10 mg by mouth at bedtime.     No current facility-administered medications for this visit.    Allergies Samuel Garza has no known allergies.   Social History Social History   Tobacco Use   Smoking status: Former   Smokeless tobacco: Never  Substance Use Topics   Alcohol use: No     Review of Systems Review of Systems  Constitutional:  Negative for chills, fever, malaise/fatigue and weight loss.  HENT:  Positive for hearing loss.   Eyes:  Negative for blurred vision and double vision.  Respiratory:  Negative for cough, hemoptysis and shortness of breath.   Cardiovascular:  Negative for chest pain, palpitations, orthopnea, leg swelling and PND.  Gastrointestinal:  Negative for abdominal pain, blood in stool, constipation, diarrhea, heartburn, melena, nausea and vomiting.  Genitourinary:  Negative for frequency and hematuria.  Musculoskeletal:  Negative for myalgias.  Skin:  Negative for itching and rash.  Neurological:  Negative for dizziness, weakness and headaches.  Psychiatric/Behavioral:  Negative for depression. The Samuel Garza is not nervous/anxious.      Physical Exam:      Body mass index is 46.21 kg/m. BP (!) 140/70   Pulse 84   Temp 97.7 F (36.5 C)   Resp 16   Ht '5\' 7"'$  (1.702 m)   Wt 295 lb 0.4 oz (133.8 kg)   SpO2 92%   BMI 46.21 kg/m   Physical Exam Constitutional:      Appearance: Normal appearance. He is obese. He is not ill-appearing.  HENT:     Head: Normocephalic and atraumatic.     Right Ear: Tympanic membrane, ear canal and external  ear normal.     Left Ear: Tympanic membrane, ear canal and external ear normal.     Nose: Nose normal. No congestion or rhinorrhea.     Mouth/Throat:     Mouth: Mucous membranes are moist.     Pharynx: Oropharynx is clear. No posterior oropharyngeal erythema.  Eyes:     General: No scleral icterus.    Conjunctiva/sclera: Conjunctivae normal.     Pupils: Pupils are equal, round, and reactive to light.  Cardiovascular:     Rate and Rhythm: Normal rate and regular rhythm.     Pulses: Normal pulses.     Heart sounds: Normal heart sounds. No murmur heard.    No friction rub. No gallop.  Pulmonary:     Effort: Pulmonary effort is normal. No respiratory distress.     Breath sounds: Normal breath sounds. No wheezing, rhonchi or rales.  Abdominal:     General: Abdomen is flat. Bowel sounds are normal. There is no distension.     Palpations: Abdomen is soft.     Tenderness: There is no abdominal tenderness.  Musculoskeletal:     Cervical back:  Neck supple. No tenderness.     Right lower leg: No edema.     Left lower leg: No edema.  Lymphadenopathy:     Cervical: No cervical adenopathy.  Skin:    General: Skin is warm and dry.     Findings: No rash.  Neurological:     General: No focal deficit present.     Mental Status: He is alert and oriented to person, place, and time.  Psychiatric:        Mood and Affect: Mood normal.        Behavior: Behavior normal.      Assessment:      Diabetic autonomic neuropathy associated with type 2 diabetes mellitus (HCC)  Chronic diastolic CHF (congestive heart failure) (HCC)  Chronic venous insufficiency  Essential hypertension  Encounter for prostate cancer screening  OSA (obstructive sleep apnea)  BMI 45.0-49.9, adult (HCC)  Morbid obesity ([place])  Cerebrovascular disease    Plan:     During the course of the visit the Samuel Garza was educated and counseled about appropriate screening and preventive services including:    Pneumococcal vaccine , HSV and COVID 19 boosters discussed  Diet review for nutrition referral? Yes ____  Not Indicated __x__   Samuel Garza Instructions (the written plan) was given to the Samuel Garza.  Chronic diastolic CHF (congestive heart failure) (HCC) He has no evidence of acute decompensation of his diastolic dysfunction except some of his longstanding chronic venous insufficiency with swelling of his legs.  He will avoid salt and continue on his diuretics.  Chronic venous insufficiency I want him to elevate his legs at rest and we will continue with compression hose.  Essential hypertension His BP is borderline.  We will see what his BP is running on his next visit.  OSA (obstructive sleep apnea) The Samuel Garza is compliant with his CPAP at this time.  Diabetic autonomic neuropathy associated with type 2 diabetes mellitus ([place]) His diabetic foot exam was normal.  He has autonomic neuropathy but denies pain or numbness.  We will continue to control his diabetes.  BMI 45.0-49.9, adult [facility]) He has severe morbid obesity associated with HTN, DM, OSA and cerebrovascular disease.  I want him to eat healthy, exercise as he can and stay active and lose weight.  Morbid obesity ([place]) As above.   Prevention He is not interested in the RSV or COVID-19 vaccines.  We will obtain some yearly labs.   Medicare Attestation I have personally reviewed: The Samuel Garza's medical and social history Their use of alcohol, tobacco or illicit drugs Their current medications and supplements The Samuel Garza's functional ability including ADLs,fall risks, home safety risks, cognitive, and hearing and visual impairment Diet and physical activities Evidence for depression or mood disorders  The Samuel Garza's weight, height, and BMI have been recorded in the chart.  I have made referrals, counseling, and provided education to the Samuel Garza based on review of the above and I have provided the Samuel Garza with a written  personalized care plan for preventive services.     Townsend Roger, MD   10/13/2022

## 2022-10-13 NOTE — Assessment & Plan Note (Signed)
The patient is compliant with his CPAP at this time.

## 2022-10-13 NOTE — Assessment & Plan Note (Signed)
He has severe morbid obesity associated with HTN, DM, OSA and cerebrovascular disease.  I want him to eat healthy, exercise as he can and stay active and lose weight.

## 2022-10-13 NOTE — Assessment & Plan Note (Signed)
I want him to elevate his legs at rest and we will continue with compression hose.

## 2022-10-13 NOTE — Assessment & Plan Note (Signed)
As above.

## 2022-10-13 NOTE — Assessment & Plan Note (Signed)
He has no evidence of acute decompensation of his diastolic dysfunction except some of his longstanding chronic venous insufficiency with swelling of his legs.  He will avoid salt and continue on his diuretics.

## 2022-10-15 LAB — CBC WITH DIFFERENTIAL/PLATELET
Basophils Absolute: 0.1 10*3/uL (ref 0.0–0.2)
Basos: 1 %
EOS (ABSOLUTE): 0.2 10*3/uL (ref 0.0–0.4)
Eos: 2 %
Hematocrit: 45.2 % (ref 37.5–51.0)
Hemoglobin: 14.9 g/dL (ref 13.0–17.7)
Immature Grans (Abs): 0 10*3/uL (ref 0.0–0.1)
Immature Granulocytes: 0 %
Lymphocytes Absolute: 3.3 10*3/uL — ABNORMAL HIGH (ref 0.7–3.1)
Lymphs: 35 %
MCH: 29.8 pg (ref 26.6–33.0)
MCHC: 33 g/dL (ref 31.5–35.7)
MCV: 90 fL (ref 79–97)
Monocytes Absolute: 0.9 10*3/uL (ref 0.1–0.9)
Monocytes: 10 %
Neutrophils Absolute: 4.8 10*3/uL (ref 1.4–7.0)
Neutrophils: 52 %
Platelets: 285 10*3/uL (ref 150–450)
RBC: 5 x10E6/uL (ref 4.14–5.80)
RDW: 12.8 % (ref 11.6–15.4)
WBC: 9.4 10*3/uL (ref 3.4–10.8)

## 2022-10-15 LAB — MICROALBUMIN / CREATININE URINE RATIO
Creatinine, Urine: 106.8 mg/dL
Microalb/Creat Ratio: 25 mg/g creat (ref 0–29)
Microalbumin, Urine: 26.9 ug/mL

## 2022-10-15 LAB — CMP14 + ANION GAP
ALT: 22 IU/L (ref 0–44)
AST: 15 IU/L (ref 0–40)
Albumin/Globulin Ratio: 2 (ref 1.2–2.2)
Albumin: 4.5 g/dL (ref 3.7–4.7)
Alkaline Phosphatase: 80 IU/L (ref 44–121)
Anion Gap: 14 mmol/L (ref 10.0–18.0)
BUN/Creatinine Ratio: 15 (ref 10–24)
BUN: 16 mg/dL (ref 8–27)
Bilirubin Total: 0.4 mg/dL (ref 0.0–1.2)
CO2: 25 mmol/L (ref 20–29)
Calcium: 9.6 mg/dL (ref 8.6–10.2)
Chloride: 99 mmol/L (ref 96–106)
Creatinine, Ser: 1.1 mg/dL (ref 0.76–1.27)
Globulin, Total: 2.2 g/dL (ref 1.5–4.5)
Glucose: 117 mg/dL — ABNORMAL HIGH (ref 70–99)
Potassium: 3.8 mmol/L (ref 3.5–5.2)
Sodium: 138 mmol/L (ref 134–144)
Total Protein: 6.7 g/dL (ref 6.0–8.5)
eGFR: 65 mL/min/{1.73_m2} (ref >59)

## 2022-10-15 LAB — PSA: Prostate Specific Ag, Serum: 1.7 ng/mL (ref 0.0–4.0)

## 2022-10-15 LAB — LIPID PANEL
Chol/HDL Ratio: 3.5 ratio (ref 0.0–5.0)
Cholesterol, Total: 169 mg/dL (ref 100–199)
HDL: 48 mg/dL (ref >39)
LDL Chol Calc (NIH): 102 mg/dL — ABNORMAL HIGH (ref 0–99)
Triglycerides: 107 mg/dL (ref 0–149)
VLDL Cholesterol Cal: 19 mg/dL (ref 5–40)

## 2022-10-15 LAB — HEMOGLOBIN A1C
Est. average glucose Bld gHb Est-mCnc: 166 mg/dL
Hgb A1c MFr Bld: 7.4 % — ABNORMAL HIGH (ref 4.8–5.6)

## 2022-10-15 LAB — TSH: TSH: 3.74 u[IU]/mL (ref 0.450–4.500)

## 2022-10-25 ENCOUNTER — Other Ambulatory Visit: Payer: Self-pay

## 2022-10-25 ENCOUNTER — Telehealth: Payer: Self-pay | Admitting: Cardiology

## 2022-10-25 MED ORDER — POTASSIUM CHLORIDE CRYS ER 10 MEQ PO TBCR: 10.0000 meq | EXTENDED_RELEASE_TABLET | Freq: Every day | ORAL | 0 refills | Status: DC

## 2022-10-25 NOTE — Telephone Encounter (Signed)
 *  STAT* If patient is at the pharmacy, call can be transferred to refill team.   1. Which medications need to be refilled? (please list name of each medication and dose if known)   potassium chloride (KLOR-CON M) 10 MEQ tablet     2. Which pharmacy/location (including street and city if local pharmacy) is medication to be sent to? [facility] [phone] - [place], [address] AT [address]    3. Do they need a 30 day or 90 day supply? 30 days  Pt made an appt with Dr. Raliegh Ip on 12/13/22 at 4 pm

## 2022-10-25 NOTE — Telephone Encounter (Signed)
Ref KlorCon [id number]  sent to pharmacy

## 2022-11-07 ENCOUNTER — Other Ambulatory Visit: Payer: Self-pay

## 2022-11-07 MED ORDER — GLIPIZIDE ER 5 MG PO TB24: 5.0000 mg | ORAL_TABLET | Freq: Every day | ORAL | 1 refills | Status: DC

## 2022-11-09 ENCOUNTER — Ambulatory Visit: Payer: Medicare Other | Attending: Cardiology | Admitting: Cardiology

## 2022-11-09 ENCOUNTER — Encounter: Payer: Self-pay | Admitting: Cardiology

## 2022-11-09 VITALS — BP 140/70 | HR 90 | Ht 67.0 in | Wt 300.1 lb

## 2022-11-09 DIAGNOSIS — E785 Hyperlipidemia, unspecified: Secondary | ICD-10-CM | POA: Diagnosis not present

## 2022-11-09 DIAGNOSIS — I5032 Chronic diastolic (congestive) heart failure: Secondary | ICD-10-CM | POA: Diagnosis not present

## 2022-11-09 DIAGNOSIS — I10 Essential (primary) hypertension: Secondary | ICD-10-CM

## 2022-11-09 MED ORDER — POTASSIUM CHLORIDE ER 10 MEQ PO TBCR: 10.0000 meq | EXTENDED_RELEASE_TABLET | Freq: Every day | ORAL | 3 refills | Status: DC

## 2022-11-09 MED ORDER — AMLODIPINE BESYLATE 5 MG PO TABS: ORAL_TABLET | ORAL | 3 refills | Status: DC

## 2022-11-09 MED ORDER — PRAVASTATIN SODIUM 20 MG PO TABS: 20.0000 mg | ORAL_TABLET | Freq: Every evening | ORAL | 3 refills | Status: DC

## 2022-11-09 NOTE — Patient Instructions (Addendum)
Medication Instructions:   INCREASE: Pravachol to '20mg'$  daily- ou may double your current dose and your next refill will reflect your new dose   Lab Work: [facility] recommends that you return for lab work in: 6 weeks You need to have labs done when you are fasting.  You can come Monday through Friday 8:00 - 4:00pm. Lunch 12-1. You do not need to make an appointment as the order has already been placed.    Testing/Procedures: None Ordered   Follow-Up: At [facility], you and your health needs are our priority.  As part of our continuing mission to provide you with exceptional heart care, we have created designated Provider Care Teams.  These Care Teams include your primary Cardiologist (physician) and Advanced Practice Providers (APPs -  Physician Assistants and Nurse Practitioners) who all work together to provide you with the care you need, when you need it.  We recommend signing up for the patient portal called "MyChart".  Sign up information is provided on this After Visit Summary.  MyChart is used to connect with patients for Virtual Visits (Telemedicine).  Patients are able to view lab/test results, encounter notes, upcoming appointments, etc.  Non-urgent messages can be sent to your provider as well.   To learn more about what you can do with MyChart, go to [email or web address].    Your next appointment:   6 month(s)  The format for your next appointment:   In Person  Provider:   Jenne Campus, MD    Other Instructions NA

## 2022-11-09 NOTE — Progress Notes (Signed)
Cardiology Office Note:    Date:  11/09/2022   ID:  Samuel Garza, DOB 1934-08-28, MRN [id number]  PCP:  Angelina Sheriff, MD  Cardiologist:  Jenne Campus, MD    Referring MD: Angelina Sheriff, MD   Chief Complaint  Patient presents with   Medication Management    History of Present Illness:    Samuel Garza is a 89 y.o. male with past medical history significant for diastolic congestive heart failure, morbid obesity, essential hypertension, prediabetes, dyslipidemia.  He comes today to my office after not being seen almost for 2 years.  He said he is doing fine he still continues to work.  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  He ended up being in the hospital couple months ago because of some shortness of breath.  Apparently there was some infection treated appropriately did have echocardiogram done which showed normal left ventricle ejection fraction.  Overall doing well.  Still have some difficulty multiple with mobility but swelling of lower extremities but overall seems to be stable.  Past Medical History:  Diagnosis Date   Angina    Cerebrovascular disease    Chest pain at rest    Chronic diastolic congestive heart failure ([place]) 10/31/2016   Diabetes mellitus, type 2 (HCC)    Hypertension    Obesities, morbid ([place])    Obesity, Class III, BMI 40-49.9 (morbid obesity) ([place]) 09/27/2011   Prediabetes 09/26/2019   Sleep apnea    Stroke [facility])    Vertigo     Past Surgical History:  Procedure Laterality Date   APPENDECTOMY  1947   age 83   CARDIAC CATHETERIZATION     GALLBLADDER SURGERY  12/2010   age 68   Left knee arthroscopy  05/2009   age 36    Current Medications: Current Meds  Medication Sig   amLODipine (NORVASC) 5 MG tablet TAKE 1 TABLET(5 MG) BY MOUTH DAILY (Patient taking differently: Take 5 mg by mouth daily. TAKE 1 TABLET(5 MG) BY MOUTH DAILY)   aspirin EC 81 MG tablet Take 81 mg by mouth daily.     carvedilol (COREG) 3.125  MG tablet Take 1 tablet by mouth 2 (two) times daily.   furosemide (LASIX) 40 MG tablet Take 1 tablet (40 mg total) by mouth 2 (two) times daily. Needs appointment for future refill / 2nd attempt   gabapentin (NEURONTIN) 300 MG capsule Take 300 mg by mouth 3 (three) times daily.   glipiZIDE (GLUCOTROL XL) 5 MG 24 hr tablet Take 1 tablet (5 mg total) by mouth daily.   metFORMIN (GLUCOPHAGE) 500 MG tablet Take 500 mg by mouth 2 (two) times daily.   potassium chloride (KLOR-CON M) 10 MEQ tablet Take 1 tablet (10 mEq total) by mouth daily. Patient needs appointment for further refills.   pravastatin (PRAVACHOL) 10 MG tablet Take 10 mg by mouth at bedtime.     Allergies:   Patient has no known allergies.   Social History   Socioeconomic History   Marital status: Married    Spouse name: Not on file   Number of children: 4   Years of education: Not on file   Highest education level: Not on file  Occupational History   Not on file  Tobacco Use   Smoking status: Former   Smokeless tobacco: Never  Vaping Use   Vaping Use: Never used  Substance and Sexual Activity   Alcohol use: No   Drug use: No  Sexual activity: Yes  Other Topics Concern   Not on file  Social History Narrative   Not on file   Social Determinants of Health   Financial Resource Strain: Not on file  Food Insecurity: Not on file  Transportation Needs: Not on file  Physical Activity: Not on file  Stress: Not on file  Social Connections: Not on file     Family History: The patient's family history includes Cancer in his sister; Coronary artery disease in his father; Diabetes in his sister; Heart attack in his brother; Stroke in his father. ROS:   Please see the history of present illness.    All 14 point review of systems negative except as described per history of present illness  EKGs/Labs/Other Studies Reviewed:      Recent Labs: 10/13/2022: ALT 22; BUN 16; Creatinine, Ser 1.10; Hemoglobin 14.9; Platelets  285; Potassium 3.8; Sodium 138; TSH 3.740  Recent Lipid Panel    Component Value Date/Time   CHOL 169 10/13/2022 1035   TRIG 107 10/13/2022 1035   HDL 48 10/13/2022 1035   CHOLHDL 3.5 10/13/2022 1035   CHOLHDL 3.9 09/28/2011 0500   VLDL 23 09/28/2011 0500   LDLCALC 102 (H) 10/13/2022 1035    Physical Exam:    VS:  BP (!) 140/70 (BP Location: Left Arm, Patient Position: Sitting)   Pulse 90   Ht '5\' 7"'$  (1.702 m)   Wt (!) 300 lb 1.3 oz (136.1 kg)   SpO2 96%   BMI 47.00 kg/m     Wt Readings from Last 3 Encounters:  11/09/22 (!) 300 lb 1.3 oz (136.1 kg)  10/13/22 295 lb 0.4 oz (133.8 kg)  09/27/20 284 lb (128.8 kg)     GEN:  Well nourished, well developed in no acute distress HEENT: Normal NECK: No JVD; No carotid bruits LYMPHATICS: No lymphadenopathy CARDIAC: RRR, no murmurs, no rubs, no gallops RESPIRATORY:  Clear to auscultation without rales, wheezing or rhonchi  ABDOMEN: Soft, non-tender, non-distended MUSCULOSKELETAL:  No edema; No deformity  SKIN: Warm and dry LOWER EXTREMITIES: no swelling NEUROLOGIC:  Alert and oriented x 3 PSYCHIATRIC:  Normal affect   ASSESSMENT:    1. Chronic diastolic CHF (congestive heart failure) ([place])   2. Essential hypertension    PLAN:    In order of problems listed above:  Chronic diastolic congestive heart failure.  He does have chronic swelling of lower extremities.  He is taking diuretic which I will continue.  I did review his K PN which show me his creatinine 1.1 from October 13, 2022 with potassium 2.8.  Will continue present management.  I did review echocardiogram done in the hospital showed preserved left ventricle ejection fraction. Essential hypertension blood pressure fairly controlled.  He is excited to be in office today.  Will recheck his blood pressure before leaving the room. Dyslipidemia did review his K PN which show me his LDL of 102 HDL 48.  I will ask him to double the dose of pravastatin will go to 20  mg.   Medication Adjustments/Labs and Tests Ordered: Current medicines are reviewed at length with the patient today.  Concerns regarding medicines are outlined above.  No orders of the defined types were placed in this encounter.  Medication changes: No orders of the defined types were placed in this encounter.   Signed, Park Liter, MD, [facility] 11/09/2022 3:40 PM    [place]

## 2022-11-09 NOTE — Addendum Note (Signed)
Addended by: Jacobo Forest D on: 11/09/2022 03:55 PM   Modules accepted: Orders

## 2022-11-16 DIAGNOSIS — L603 Nail dystrophy: Secondary | ICD-10-CM | POA: Diagnosis not present

## 2022-11-16 DIAGNOSIS — I739 Peripheral vascular disease, unspecified: Secondary | ICD-10-CM | POA: Diagnosis not present

## 2022-11-20 DIAGNOSIS — G4733 Obstructive sleep apnea (adult) (pediatric): Secondary | ICD-10-CM | POA: Diagnosis not present

## 2022-11-28 ENCOUNTER — Encounter: Payer: Self-pay | Admitting: Internal Medicine

## 2022-11-28 ENCOUNTER — Ambulatory Visit: Payer: Medicare Other | Admitting: Internal Medicine

## 2022-11-28 VITALS — BP 142/74 | HR 78 | Temp 97.6°F | Resp 20 | Ht 67.0 in | Wt 283.8 lb

## 2022-11-28 DIAGNOSIS — I10 Essential (primary) hypertension: Secondary | ICD-10-CM | POA: Diagnosis not present

## 2022-11-28 MED ORDER — LISINOPRIL 5 MG PO TABS: 5.0000 mg | ORAL_TABLET | Freq: Every day | ORAL | 3 refills | Status: DC

## 2022-11-28 NOTE — Progress Notes (Signed)
Office Visit  Subjective   Patient ID: Samuel Garza   DOB: July 19, 1934   Age: 89 y.o.   MRN: [id number]   Chief Complaint Chief Complaint  Patient presents with   Hypertension    Elevated BP readings     History of Present Illness The patient is a 89 year old Caucasian/White male who presents for a follow-up evaluation of hypertension.  We saw him last month where we noted his BP was borderline.  The patient has been checking his blood pressure at home. He states his systolic BP ranges 097-353'G. The patient's current medications include: amlodipine '5mg'$  daily, Lasix '40mg'$  BID. The patient has been tolerating his medications well. The patient denies any headache, visual changes, dizziness, lightheadness, chest pain, shortness of breath, weakness/numbness, and edema. He reports there have been no other symptoms noted.      Past Medical History Past Medical History:  Diagnosis Date   Angina    Cerebrovascular disease    Chest pain at rest    Chronic diastolic congestive heart failure ([place]) 10/31/2016   Diabetes mellitus, type 2 (HCC)    Hypertension    Obesities, morbid ([place])    Obesity, Class III, BMI 40-49.9 (morbid obesity) ([place]) 09/27/2011   Prediabetes 09/26/2019   Sleep apnea    Stroke (HCC)    Vertigo      Allergies No Known Allergies   Medications  Current Outpatient Medications:    amLODipine (NORVASC) 5 MG tablet, TAKE 1 TABLET(5 MG) BY MOUTH DAILY, Disp: 90 tablet, Rfl: 3   aspirin EC 81 MG tablet, Take 81 mg by mouth daily.  , Disp: , Rfl:    carvedilol (COREG) 3.125 MG tablet, Take 1 tablet by mouth 2 (two) times daily., Disp: , Rfl:    furosemide (LASIX) 40 MG tablet, Take 1 tablet (40 mg total) by mouth 2 (two) times daily. Needs appointment for future refill / 2nd attempt, Disp: 30 tablet, Rfl: 0   gabapentin (NEURONTIN) 300 MG capsule, Take 300 mg by mouth 3 (three) times daily., Disp: , Rfl:    glipiZIDE (GLUCOTROL XL) 5 MG 24 hr tablet, Take 1 tablet (5  mg total) by mouth daily., Disp: 90 tablet, Rfl: 1   metFORMIN (GLUCOPHAGE) 500 MG tablet, Take 500 mg by mouth 2 (two) times daily., Disp: , Rfl:    potassium chloride (KLOR-CON M) 10 MEQ tablet, Take 1 tablet (10 mEq total) by mouth daily. Patient needs appointment for further refills., Disp: 90 tablet, Rfl: 0   potassium chloride (KLOR-CON) 10 MEQ tablet, Take 1 tablet (10 mEq total) by mouth daily., Disp: 90 tablet, Rfl: 3   pravastatin (PRAVACHOL) 20 MG tablet, Take 1 tablet (20 mg total) by mouth every evening., Disp: 90 tablet, Rfl: 3   Review of Systems Review of Systems  Constitutional:  Negative for chills and fever.  Eyes:  Negative for blurred vision and double vision.  Respiratory:  Positive for shortness of breath. Negative for cough.   Cardiovascular:  Positive for leg swelling. Negative for chest pain and palpitations.  Gastrointestinal:  Negative for constipation, diarrhea, nausea and vomiting.  Neurological:  Negative for dizziness, weakness and headaches.       Objective:    Vitals BP (!) 142/74 (BP Location: Left Arm, Patient Position: Sitting, Cuff Size: Normal)   Pulse 78   Temp 97.6 F (36.4 C) (Temporal)   Resp 20   Ht '5\' 7"'$  (1.702 m)   Wt 283 lb 12.8 oz (128.7  kg)   SpO2 96%   BMI 44.45 kg/m    Physical Examination Physical Exam Constitutional:      Appearance: Normal appearance. He is not ill-appearing.  Cardiovascular:     Rate and Rhythm: Normal rate.     Pulses: Normal pulses.     Heart sounds: No murmur heard.    No friction rub. No gallop.  Pulmonary:     Effort: Pulmonary effort is normal. No respiratory distress.     Breath sounds: No wheezing, rhonchi or rales.  Abdominal:     General: Abdomen is flat. Bowel sounds are normal. There is no distension.     Palpations: Abdomen is soft.     Tenderness: There is no abdominal tenderness.  Musculoskeletal:     Right lower leg: Edema present.     Left lower leg: Edema present.  Skin:     Findings: No lesion or rash.  Neurological:     Mental Status: He is alert.        Assessment & Plan:   Essential hypertension His BP remains borderline.  We will continue his other meds and we will start him on lisinopril '5mg'$  daily.    Return in about 3 months (around 02/27/2023).   Townsend Roger, MD

## 2022-11-28 NOTE — Assessment & Plan Note (Signed)
His BP remains borderline.  We will continue his other meds and we will start him on lisinopril '5mg'$  daily.

## 2022-12-13 ENCOUNTER — Ambulatory Visit: Payer: Medicare Other | Admitting: Cardiology

## 2023-01-03 DIAGNOSIS — E785 Hyperlipidemia, unspecified: Secondary | ICD-10-CM | POA: Diagnosis not present

## 2023-01-04 LAB — ALT: ALT: 20 IU/L (ref 0–44)

## 2023-01-04 LAB — LIPID PANEL
Chol/HDL Ratio: 2.6 ratio (ref 0.0–5.0)
Cholesterol, Total: 136 mg/dL (ref 100–199)
HDL: 52 mg/dL (ref >39)
LDL Chol Calc (NIH): 67 mg/dL (ref 0–99)
Triglycerides: 88 mg/dL (ref 0–149)
VLDL Cholesterol Cal: 17 mg/dL (ref 5–40)

## 2023-01-04 LAB — AST: AST: 19 IU/L (ref 0–40)

## 2023-01-05 DIAGNOSIS — G4733 Obstructive sleep apnea (adult) (pediatric): Secondary | ICD-10-CM | POA: Diagnosis not present

## 2023-01-08 ENCOUNTER — Telehealth: Payer: Self-pay

## 2023-01-08 NOTE — Telephone Encounter (Signed)
Patient notified of results.

## 2023-01-08 NOTE — Telephone Encounter (Signed)
-----   Message from Park Liter, MD sent at 01/05/2023 10:23 AM EST ----- Chol looks perfect

## 2023-01-10 ENCOUNTER — Encounter: Payer: Self-pay | Admitting: Internal Medicine

## 2023-01-10 ENCOUNTER — Ambulatory Visit: Payer: Medicare Other | Admitting: Internal Medicine

## 2023-01-10 VITALS — BP 132/64 | HR 93 | Temp 96.8°F | Resp 20 | Ht 67.0 in | Wt 287.2 lb

## 2023-01-10 DIAGNOSIS — R059 Cough, unspecified: Secondary | ICD-10-CM | POA: Diagnosis not present

## 2023-01-10 DIAGNOSIS — J40 Bronchitis, not specified as acute or chronic: Secondary | ICD-10-CM | POA: Insufficient documentation

## 2023-01-10 LAB — POC COVID19 BINAXNOW: SARS Coronavirus 2 Ag: NEGATIVE

## 2023-01-10 LAB — POC INFLUENZA A&B (BINAX/QUICKVUE)
Influenza A, POC: NEGATIVE
Influenza B, POC: NEGATIVE

## 2023-01-10 MED ORDER — DOXYCYCLINE MONOHYDRATE 100 MG PO CAPS: 100.0000 mg | ORAL_CAPSULE | Freq: Two times a day (BID) | ORAL | 0 refills | Status: DC

## 2023-01-10 NOTE — Assessment & Plan Note (Signed)
His COVID-19 and flu testing was negative.  His lungs are clear on my exam.  WE will start him on doxycycline for bronchitis and I want him to use robitssuin cough syrup.  Continue supportive care.

## 2023-01-10 NOTE — Progress Notes (Signed)
Office Visit  Subjective   Patient ID: Samuel Garza   DOB: [birth year]   Age: 89 y.o.   MRN: [id number]   Chief Complaint Chief Complaint  Patient presents with   Cough    Started Monday     History of Present Illness The patient is a 89 year old male who presents with upper respiratory tract symptoms which began 2-3 days ago. He states this started as chest congestion and he has associated productive cough of green sputum.  He has no additional complaints. He denies nasal congestion, sore throat, body aches, fever, headache, shortness of breath, weakness, nausea, vomiting, diarrhea,  wheezing, and chills. Alleviating factors: alka seltzer plus The patient's past medical history is noncontributory.  He does have a history of OSA and uses a CPAP machine.      Past Medical History Past Medical History:  Diagnosis Date   Angina    Cerebrovascular disease    Chest pain at rest    Chronic diastolic congestive heart failure ([place]) 10/31/2016   Diabetes mellitus, type 2 (HCC)    Hypertension    Obesities, morbid ([place])    Obesity, Class III, BMI 40-49.9 (morbid obesity) ([place]) 09/27/2011   Prediabetes 09/26/2019   Sleep apnea    Stroke (HCC)    Vertigo      Allergies No Known Allergies   Medications  Current Outpatient Medications:    amLODipine (NORVASC) 5 MG tablet, TAKE 1 TABLET(5 MG) BY MOUTH DAILY, Disp: 90 tablet, Rfl: 3   aspirin EC 81 MG tablet, Take 81 mg by mouth daily.  , Disp: , Rfl:    carvedilol (COREG) 3.125 MG tablet, Take 1 tablet by mouth 2 (two) times daily., Disp: , Rfl:    furosemide (LASIX) 40 MG tablet, Take 1 tablet (40 mg total) by mouth 2 (two) times daily. Needs appointment for future refill / 2nd attempt, Disp: 30 tablet, Rfl: 0   gabapentin (NEURONTIN) 300 MG capsule, Take 300 mg by mouth 3 (three) times daily., Disp: , Rfl:    glipiZIDE (GLUCOTROL XL) 5 MG 24 hr tablet, Take 1 tablet (5 mg total) by mouth daily., Disp: 90 tablet, Rfl: 1    lisinopril (ZESTRIL) 5 MG tablet, Take 1 tablet (5 mg total) by mouth daily., Disp: 90 tablet, Rfl: 3   metFORMIN (GLUCOPHAGE) 500 MG tablet, Take 500 mg by mouth 2 (two) times daily., Disp: , Rfl:    potassium chloride (KLOR-CON M) 10 MEQ tablet, Take 1 tablet (10 mEq total) by mouth daily. Patient needs appointment for further refills., Disp: 90 tablet, Rfl: 0   potassium chloride (KLOR-CON) 10 MEQ tablet, Take 1 tablet (10 mEq total) by mouth daily., Disp: 90 tablet, Rfl: 3   pravastatin (PRAVACHOL) 20 MG tablet, Take 1 tablet (20 mg total) by mouth every evening., Disp: 90 tablet, Rfl: 3   Review of Systems Review of Systems  Constitutional:  Negative for chills and fever.  Respiratory:  Positive for cough and sputum production. Negative for hemoptysis, shortness of breath and wheezing.   Cardiovascular:  Negative for chest pain and leg swelling.  Gastrointestinal:  Negative for abdominal pain, constipation, diarrhea, nausea and vomiting.  Musculoskeletal:  Negative for myalgias.  Neurological:  Negative for dizziness, weakness and headaches.       Objective:    Vitals BP 132/64 (BP Location: Left Arm, Patient Position: Sitting, Cuff Size: Normal)   Pulse 93   Temp (!) 96.8 F (36 C) (Temporal)   Resp  20   Ht '5\' 7"'$  (1.702 m)   Wt 287 lb 3.2 oz (130.3 kg)   SpO2 97%   BMI 44.98 kg/m    Physical Examination Physical Exam Constitutional:      Appearance: Normal appearance. He is not ill-appearing.  Cardiovascular:     Rate and Rhythm: Normal rate and regular rhythm.     Pulses: Normal pulses.     Heart sounds: No murmur heard.    No friction rub. No gallop.  Pulmonary:     Effort: Pulmonary effort is normal. No respiratory distress.     Breath sounds: No wheezing, rhonchi or rales.  Abdominal:     General: Abdomen is flat. Bowel sounds are normal. There is no distension.     Palpations: Abdomen is soft.     Tenderness: There is no abdominal tenderness.   Musculoskeletal:     Right lower leg: No edema.     Left lower leg: No edema.  Skin:    General: Skin is warm and dry.     Findings: No rash.  Neurological:     Mental Status: He is alert.        Assessment & Plan:   Bronchitis His COVID-19 and flu testing was negative.  His lungs are clear on my exam.  WE will start him on doxycycline for bronchitis and I want him to use robitssuin cough syrup.  Continue supportive care.    No follow-ups on file.   Townsend Roger, MD

## 2023-01-17 ENCOUNTER — Ambulatory Visit: Payer: Medicare Other | Admitting: Internal Medicine

## 2023-01-23 ENCOUNTER — Other Ambulatory Visit: Payer: Self-pay | Admitting: Cardiology

## 2023-01-24 DIAGNOSIS — Z961 Presence of intraocular lens: Secondary | ICD-10-CM | POA: Diagnosis not present

## 2023-01-24 DIAGNOSIS — H524 Presbyopia: Secondary | ICD-10-CM | POA: Diagnosis not present

## 2023-01-24 DIAGNOSIS — E119 Type 2 diabetes mellitus without complications: Secondary | ICD-10-CM | POA: Diagnosis not present

## 2023-01-24 DIAGNOSIS — D3132 Benign neoplasm of left choroid: Secondary | ICD-10-CM | POA: Diagnosis not present

## 2023-01-24 DIAGNOSIS — H5213 Myopia, bilateral: Secondary | ICD-10-CM | POA: Diagnosis not present

## 2023-01-24 DIAGNOSIS — H52223 Regular astigmatism, bilateral: Secondary | ICD-10-CM | POA: Diagnosis not present

## 2023-02-05 DIAGNOSIS — G4733 Obstructive sleep apnea (adult) (pediatric): Secondary | ICD-10-CM | POA: Diagnosis not present

## 2023-02-20 DIAGNOSIS — L570 Actinic keratosis: Secondary | ICD-10-CM | POA: Diagnosis not present

## 2023-02-20 DIAGNOSIS — D485 Neoplasm of uncertain behavior of skin: Secondary | ICD-10-CM | POA: Diagnosis not present

## 2023-02-26 ENCOUNTER — Telehealth: Payer: Self-pay

## 2023-02-26 NOTE — Progress Notes (Signed)
CMCS Notes  Scheduling: Sherlene Shams Conservation officer, historic buildings) spoke with pt or pt representative on 01/18/23. They scheduled pt's initial CMCS Televisit on 03/02/23.  Start: 02/26/23, 10:57 AM. Reviewed charts/consult notes/labs/vitals/medications/documents/TEs to complete chart prep for pt's initial CMCS Televisit. Used [employer] and [employer]. Pt is scheduled to speak with Lynann Bologna, PharmD on 03/02/23. Total time: 33 minutes (non-billable time). Anne Ng, LPN.

## 2023-02-28 ENCOUNTER — Ambulatory Visit: Payer: Medicare Other | Admitting: Internal Medicine

## 2023-02-28 DIAGNOSIS — C44329 Squamous cell carcinoma of skin of other parts of face: Secondary | ICD-10-CM | POA: Diagnosis not present

## 2023-03-07 DIAGNOSIS — G4733 Obstructive sleep apnea (adult) (pediatric): Secondary | ICD-10-CM | POA: Diagnosis not present

## 2023-03-08 DIAGNOSIS — I739 Peripheral vascular disease, unspecified: Secondary | ICD-10-CM | POA: Diagnosis not present

## 2023-03-08 DIAGNOSIS — L603 Nail dystrophy: Secondary | ICD-10-CM | POA: Diagnosis not present

## 2023-03-08 DIAGNOSIS — E1151 Type 2 diabetes mellitus with diabetic peripheral angiopathy without gangrene: Secondary | ICD-10-CM | POA: Diagnosis not present

## 2023-03-27 DIAGNOSIS — H18413 Arcus senilis, bilateral: Secondary | ICD-10-CM | POA: Diagnosis not present

## 2023-03-27 DIAGNOSIS — Z961 Presence of intraocular lens: Secondary | ICD-10-CM | POA: Diagnosis not present

## 2023-03-27 DIAGNOSIS — H26492 Other secondary cataract, left eye: Secondary | ICD-10-CM | POA: Diagnosis not present

## 2023-03-27 DIAGNOSIS — H02831 Dermatochalasis of right upper eyelid: Secondary | ICD-10-CM | POA: Diagnosis not present

## 2023-03-28 ENCOUNTER — Ambulatory Visit: Payer: Medicare Other | Admitting: Internal Medicine

## 2023-03-28 ENCOUNTER — Encounter: Payer: Self-pay | Admitting: Internal Medicine

## 2023-03-28 VITALS — BP 140/78 | HR 80 | Temp 97.5°F | Resp 16 | Ht 67.0 in | Wt 297.6 lb

## 2023-03-28 DIAGNOSIS — E1143 Type 2 diabetes mellitus with diabetic autonomic (poly)neuropathy: Secondary | ICD-10-CM

## 2023-03-28 DIAGNOSIS — E7800 Pure hypercholesterolemia, unspecified: Secondary | ICD-10-CM | POA: Insufficient documentation

## 2023-03-28 DIAGNOSIS — I10 Essential (primary) hypertension: Secondary | ICD-10-CM

## 2023-03-28 MED ORDER — LISINOPRIL 10 MG PO TABS: 10.0000 mg | ORAL_TABLET | Freq: Every day | ORAL | 3 refills | Status: DC

## 2023-03-28 NOTE — Progress Notes (Signed)
Office Visit  Subjective   Patient ID: Samuel Garza   DOB: Jul 01, 1934   Age: 89 y.o.   MRN: [id number]   Chief Complaint Chief Complaint  Patient presents with   Follow-up     History of Present Illness The patient is a 89 year old Caucasian/White male who presents for a follow-up evaluation of hypertension.  ON his last visit, his BP was borderline and we started him on lisinopril 5mg  daily.  We saw him last month where we noted his BP was borderline.  The patient has been checking his blood pressure at home. He states his systolic BP ranges 130-140's. The patient's current medications include: amlodipine 5mg  daily, Lasix 40mg  BID and lisinopril 5mg  daily. The patient has been tolerating his medications well. The patient denies any headache, visual changes, dizziness, lightheadness, chest pain, shortness of breath, weakness/numbness, and edema. He reports there have been no other symptoms noted.    The patient is a 89 year old Caucasian/White male who returns for a follow-up visit for his T2 diabetes.  We checked his A1c in 10/2022 and his A1c 7.5%.  I asked him to increase metformin ER at that time but he states he never did start back on it.  He states it made him fatigued.  Last year, his diabetes was not controlled and I added rybelsus 3mg  to his regimen however we stopped this due to fatigue and decreased energy levels/malaise with nausea but no vomiting.  I started him on glipizide ER 5mg  daily.   Earlier this year, his diabetes was not controlled and we increased his metformin ER from 500mg  BID to 1000mg  BID.  He did stop his metformin due to him having fatigue.  He states he was diagnosed with T2 diabetes about 6-7 years ago.  He is currently on glipizide ER 5mg  daily.   He specifically denies unexplained abdominal pain, nausea or vomiting and documented hypoglycemia. He checks blood sugars at least 2-3 times per week and they tend to range somewhere between 100 and 150 mg/dl fasting.   His last HgBA1c was done 3 months and was 7.5%. He came in fasting today in anticipation of lab work. He has a history of autonomic neuropathy. He denies any diabetic retinopathy, nephropathy or cardiovascular disease.  His last dilated eye exam was on 12/07/2021 and there was no evidence of diabetic retinopathy.   Increase his metformin to 1000mg  BID and change his pravastatin to lipitor 40mg  qhs as his cholesterol is not controlled.  The patient returns today for routine followup on his cholesterol. I saw him in 10/2022 for a yearly exam and his cholesterol was not controlled.  I asked him to change his pravastatin to lipitor at that time.  He states he took lipitor but it made him week.  He saw Dr. Kirtland Bouchard in cardiology who increased his pravastatin from 10mg  to 20mg  qhs and repeated his FLP after that in 12/2022 and his cholesterol looked well.  Overall, he states he is doing well and is without any complaints or problems at this time. He specifically denies nausea, vomiting, diarrhea, myalgias, and fatigue. He remains on dietary management and pravastatin 20mg  qhs.  He is fasting in anticipation for labs today.      Past Medical History Past Medical History:  Diagnosis Date   Angina    Cerebrovascular disease    Chest pain at rest    Chronic diastolic congestive heart failure (HCC) 10/31/2016   Diabetes mellitus, type 2 (HCC)  Hypertension    Obesities, morbid (HCC)    Obesity, Class III, BMI 40-49.9 (morbid obesity) (HCC) 09/27/2011   Prediabetes 09/26/2019   Sleep apnea    Stroke (HCC)    Vertigo      Allergies No Known Allergies   Medications  Current Outpatient Medications:    amLODipine (NORVASC) 5 MG tablet, TAKE 1 TABLET(5 MG) BY MOUTH DAILY, Disp: 90 tablet, Rfl: 3   aspirin EC 81 MG tablet, Take 81 mg by mouth daily.  , Disp: , Rfl:    carvedilol (COREG) 3.125 MG tablet, Take 1 tablet by mouth 2 (two) times daily., Disp: , Rfl:    furosemide (LASIX) 40 MG tablet, Take 1  tablet (40 mg total) by mouth 2 (two) times daily. Needs appointment for future refill / 2nd attempt, Disp: 30 tablet, Rfl: 0   gabapentin (NEURONTIN) 300 MG capsule, Take 300 mg by mouth 3 (three) times daily., Disp: , Rfl:    glipiZIDE (GLUCOTROL XL) 5 MG 24 hr tablet, Take 1 tablet (5 mg total) by mouth daily., Disp: 90 tablet, Rfl: 1   lisinopril (ZESTRIL) 5 MG tablet, Take 1 tablet (5 mg total) by mouth daily., Disp: 90 tablet, Rfl: 3   metFORMIN (GLUCOPHAGE) 500 MG tablet, Take 500 mg by mouth 2 (two) times daily., Disp: , Rfl:    potassium chloride (KLOR-CON M) 10 MEQ tablet, Take 1 tablet (10 mEq total) by mouth daily. Patient needs appointment for further refills., Disp: 90 tablet, Rfl: 0   potassium chloride (KLOR-CON) 10 MEQ tablet, Take 1 tablet (10 mEq total) by mouth daily., Disp: 90 tablet, Rfl: 3   pravastatin (PRAVACHOL) 20 MG tablet, Take 1 tablet (20 mg total) by mouth every evening., Disp: 90 tablet, Rfl: 3   Review of Systems Review of Systems  Constitutional:  Negative for chills and fever.  Eyes:  Negative for blurred vision and double vision.  Respiratory:  Negative for cough, shortness of breath and wheezing.   Cardiovascular:  Negative for chest pain, palpitations and leg swelling.  Gastrointestinal:  Negative for abdominal pain, diarrhea, nausea and vomiting.  Genitourinary:  Negative for frequency.  Musculoskeletal:  Negative for myalgias.  Skin:  Negative for itching and rash.  Neurological:  Negative for dizziness, weakness and headaches.  Endo/Heme/Allergies:  Negative for polydipsia.  Psychiatric/Behavioral:  Negative for depression. The patient is not nervous/anxious.        Objective:    Vitals BP (!) 140/78   Pulse 80   Temp (!) 97.5 F (36.4 C)   Resp 16   Ht 5\' 7"  (1.702 m)   Wt 297 lb 9.6 oz (135 kg)   SpO2 97%   BMI 46.61 kg/m    Physical Examination Physical Exam Constitutional:      Appearance: Normal appearance. He is not  ill-appearing.  Cardiovascular:     Rate and Rhythm: Normal rate and regular rhythm.     Pulses: Normal pulses.     Heart sounds: No murmur heard.    No friction rub. No gallop.  Pulmonary:     Effort: Pulmonary effort is normal. No respiratory distress.     Breath sounds: No wheezing, rhonchi or rales.  Abdominal:     General: Abdomen is flat. Bowel sounds are normal. There is no distension.     Palpations: Abdomen is soft.     Tenderness: There is no abdominal tenderness.  Musculoskeletal:     Right lower leg: No edema.     Left  lower leg: No edema.  Skin:    General: Skin is warm and dry.     Findings: No rash.  Neurological:     Mental Status: He is alert.        Assessment & Plan:   Essential hypertension His BP is still borderline.  We will increase his lisinopril from 5mg  to 10mg  daily.  Diabetic autonomic neuropathy associated with type 2 diabetes mellitus (HCC) He did not restart his metformin.  He is just on glipizide.  We will recheck his A1c and give further instructions once this is back.  Hypercholesterolemia Dr. Kirtland Bouchard rechecked his FLP in 12/2022 and it is controlled.    Return in about 3 months (around 06/28/2023).   Crist Fat, MD

## 2023-03-28 NOTE — Assessment & Plan Note (Signed)
His BP is still borderline.  We will increase his lisinopril from 5mg  to 10mg  daily.

## 2023-03-28 NOTE — Assessment & Plan Note (Signed)
Dr. Kirtland Bouchard rechecked his FLP in 12/2022 and it is controlled.

## 2023-03-28 NOTE — Assessment & Plan Note (Signed)
He did not restart his metformin.  He is just on glipizide.  We will recheck his A1c and give further instructions once this is back.

## 2023-03-29 LAB — HEMOGLOBIN A1C
Est. average glucose Bld gHb Est-mCnc: 169 mg/dL
Hgb A1c MFr Bld: 7.5 % — ABNORMAL HIGH (ref 4.8–5.6)

## 2023-04-05 ENCOUNTER — Other Ambulatory Visit: Payer: Self-pay

## 2023-04-05 MED ORDER — METFORMIN HCL ER 500 MG PO TB24: 500.0000 mg | ORAL_TABLET | Freq: Every day | ORAL | 1 refills | Status: DC

## 2023-04-07 DIAGNOSIS — G4733 Obstructive sleep apnea (adult) (pediatric): Secondary | ICD-10-CM | POA: Diagnosis not present

## 2023-04-23 ENCOUNTER — Other Ambulatory Visit: Payer: Self-pay | Admitting: Internal Medicine

## 2023-04-30 ENCOUNTER — Other Ambulatory Visit: Payer: Self-pay

## 2023-04-30 MED ORDER — FUROSEMIDE 40 MG PO TABS: 40.0000 mg | ORAL_TABLET | Freq: Two times a day (BID) | ORAL | 0 refills | Status: DC

## 2023-05-05 ENCOUNTER — Other Ambulatory Visit: Payer: Self-pay | Admitting: Internal Medicine

## 2023-05-07 DIAGNOSIS — G4733 Obstructive sleep apnea (adult) (pediatric): Secondary | ICD-10-CM | POA: Diagnosis not present

## 2023-05-18 ENCOUNTER — Other Ambulatory Visit: Payer: Self-pay | Admitting: Internal Medicine

## 2023-06-05 ENCOUNTER — Other Ambulatory Visit: Payer: Self-pay | Admitting: Internal Medicine

## 2023-06-07 DIAGNOSIS — G4733 Obstructive sleep apnea (adult) (pediatric): Secondary | ICD-10-CM | POA: Diagnosis not present

## 2023-06-17 ENCOUNTER — Other Ambulatory Visit: Payer: Self-pay | Admitting: Internal Medicine

## 2023-06-25 ENCOUNTER — Ambulatory Visit: Payer: Medicare Other | Admitting: Internal Medicine

## 2023-06-25 ENCOUNTER — Encounter: Payer: Self-pay | Admitting: Internal Medicine

## 2023-06-25 VITALS — BP 132/78 | HR 78 | Temp 97.6°F | Resp 19 | Ht 67.0 in | Wt 297.6 lb

## 2023-06-25 DIAGNOSIS — E7800 Pure hypercholesterolemia, unspecified: Secondary | ICD-10-CM | POA: Diagnosis not present

## 2023-06-25 DIAGNOSIS — I10 Essential (primary) hypertension: Secondary | ICD-10-CM

## 2023-06-25 DIAGNOSIS — E1143 Type 2 diabetes mellitus with diabetic autonomic (poly)neuropathy: Secondary | ICD-10-CM

## 2023-06-25 MED ORDER — FUROSEMIDE 40 MG PO TABS: 40.0000 mg | ORAL_TABLET | Freq: Two times a day (BID) | ORAL | 3 refills | Status: DC

## 2023-06-25 NOTE — Assessment & Plan Note (Signed)
We will check his FLP today where he is on pravastatin 20mg  at bedtime.

## 2023-06-25 NOTE — Progress Notes (Signed)
Office Visit  Subjective   Patient ID: Samuel Garza   DOB: 1934-03-12   Age: 89 y.o.   MRN: [id number]   Chief Complaint Chief Complaint  Patient presents with   Follow-up     History of Present Illness The patient is a 89 year old Caucasian/White male who presents for a follow-up evaluation of hypertension.  On his last visit, his BP was not controlled and we increased his lisinopril from 5mg  to 10mg  daily.  The patient has been checking his blood pressure at home. He states his systolic BP ranges 120-130's. The patient's current medications include: amlodipine 5mg  daily, Lasix 40mg  BID and lisinopril 10mg  daily. The patient has been tolerating his medications well. The patient denies any headache, visual changes, dizziness, lightheadness, chest pain, shortness of breath, weakness/numbness, and edema. He reports there have been no other symptoms noted.     The patient is a 89 year old Caucasian/White male who returns for a follow-up visit for his T2 diabetes.  On his last visit, his diabetes was not controlled.  He was previously on metformin ER but stopped this due to fatigue.  I asked him to restart low dose metformin ER 500mg  daily and since starting it, he denies any fatigue.  Last year, his diabetes was not controlled and I added rybelsus 3mg  to his regimen however we stopped this due to fatigue and decreased energy levels/malaise with nausea but no vomiting.  I started him on glipizide ER 5mg  daily.   He states he was diagnosed with T2 diabetes about 6-7 years ago.  He is currently on glipizide ER 5mg  daily and metformin ER 500mg  daily.   He specifically denies unexplained abdominal pain, nausea or vomiting, diarrhea, or documented hypoglycemia. He checks blood sugars at least 2-3 times per week and they tend to range somewhere between 100 and 150 mg/dl fasting.  His last HgBA1c was done 3 months and was 7.5%. He came in fasting today in anticipation of lab work. He has a history of  autonomic neuropathy. He denies any diabetic retinopathy, nephropathy or cardiovascular disease.  He states he had a dilated eye exam in 03/2023 but I do not have this report.    The patient returns today for routine followup on his cholesterol. I saw him in 10/2022 for a yearly exam and his cholesterol was not controlled.  I asked him to change his pravastatin to lipitor at that time.  He states he took lipitor but it made him week.  He saw Dr. Kirtland Bouchard in cardiology who increased his pravastatin from 10mg  to 20mg  qhs and repeated his FLP after that in 12/2022 and his cholesterol looked well.  Overall, he states he is doing well and is without any complaints or problems at this time. He specifically denies nausea, vomiting, diarrhea, myalgias, and fatigue. He remains on dietary management and pravastatin 20mg  qhs.  He last ate about 6:30 AM this morning.     Past Medical History Past Medical History:  Diagnosis Date   Angina    Cerebrovascular disease    Chest pain at rest    Chronic diastolic congestive heart failure (HCC) 10/31/2016   Diabetes mellitus, type 2 (HCC)    Hypertension    Obesities, morbid (HCC)    Obesity, Class III, BMI 40-49.9 (morbid obesity) (HCC) 09/27/2011   Prediabetes 09/26/2019   Sleep apnea    Stroke (HCC)    Vertigo      Allergies No Known Allergies   Medications  Current Outpatient Medications:    amLODipine (NORVASC) 5 MG tablet, TAKE 1 TABLET(5 MG) BY MOUTH DAILY, Disp: 90 tablet, Rfl: 3   aspirin EC 81 MG tablet, Take 81 mg by mouth daily.  , Disp: , Rfl:    carvedilol (COREG) 3.125 MG tablet, Take 1 tablet by mouth 2 (two) times daily., Disp: , Rfl:    furosemide (LASIX) 40 MG tablet, TAKE 1 TABLET(40 MG) BY MOUTH TWICE DAILY, Disp: 30 tablet, Rfl: 0   gabapentin (NEURONTIN) 300 MG capsule, Take 300 mg by mouth 3 (three) times daily., Disp: , Rfl:    glipiZIDE (GLUCOTROL XL) 5 MG 24 hr tablet, TAKE 1 TABLET(5 MG) BY MOUTH DAILY, Disp: 90 tablet, Rfl: 1    lisinopril (ZESTRIL) 10 MG tablet, Take 1 tablet (10 mg total) by mouth daily., Disp: 90 tablet, Rfl: 3   meclizine (ANTIVERT) 25 MG tablet, TAKE 1 TABLET(25 MG) BY MOUTH TWICE DAILY AS NEEDED, Disp: 90 tablet, Rfl: 1   metFORMIN (GLUCOPHAGE-XR) 500 MG 24 hr tablet, Take 1 tablet (500 mg total) by mouth daily with breakfast., Disp: 90 tablet, Rfl: 1   potassium chloride (KLOR-CON M) 10 MEQ tablet, Take 1 tablet (10 mEq total) by mouth daily. Patient needs appointment for further refills., Disp: 90 tablet, Rfl: 0   potassium chloride (KLOR-CON) 10 MEQ tablet, Take 1 tablet (10 mEq total) by mouth daily., Disp: 90 tablet, Rfl: 3   pravastatin (PRAVACHOL) 20 MG tablet, Take 1 tablet (20 mg total) by mouth every evening., Disp: 90 tablet, Rfl: 3   Review of Systems Review of Systems  Constitutional:  Negative for chills and fever.  Eyes:  Negative for blurred vision and double vision.  Respiratory:  Negative for cough and shortness of breath.   Cardiovascular:  Negative for chest pain, palpitations and leg swelling.  Gastrointestinal:  Negative for abdominal pain, constipation, diarrhea, nausea and vomiting.  Genitourinary:  Negative for frequency.  Musculoskeletal:  Negative for myalgias.  Skin:  Negative for itching and rash.  Neurological:  Negative for dizziness, weakness and headaches.  Endo/Heme/Allergies:  Negative for polydipsia.       Objective:    Vitals BP 132/78   Pulse 78   Temp 97.6 F (36.4 C)   Resp 19   Ht 5\' 7"  (1.702 m)   Wt 297 lb 9.6 oz (135 kg)   SpO2 94%   BMI 46.61 kg/m    Physical Examination Physical Exam Constitutional:      Appearance: Normal appearance. He is not ill-appearing.  Cardiovascular:     Rate and Rhythm: Normal rate and regular rhythm.     Pulses: Normal pulses.     Heart sounds: No murmur heard.    No friction rub. No gallop.  Pulmonary:     Effort: Pulmonary effort is normal. No respiratory distress.     Breath sounds: No wheezing,  rhonchi or rales.  Abdominal:     General: Abdomen is flat. Bowel sounds are normal. There is no distension.     Palpations: Abdomen is soft.     Tenderness: There is no abdominal tenderness.  Musculoskeletal:     Right lower leg: Edema present.     Left lower leg: Edema present.     Comments: 1-2+ edema in both legs up to his knees- unchanged from prior exams.  Skin:    General: Skin is warm and dry.     Findings: No rash.  Neurological:     General: No focal deficit  present.     Mental Status: He is alert and oriented to person, place, and time.  Psychiatric:        Mood and Affect: Mood normal.        Behavior: Behavior normal.        Assessment & Plan:   Essential hypertension His BP is doing better since we went up on his lisinopril.  We will continue his current meds.  Diabetic autonomic neuropathy associated with type 2 diabetes mellitus (HCC) He went back on metformin but is not having any problems.  We will check his HgBA1c today.  Hypercholesterolemia We will check his FLP today where he is on pravastatin 20mg  at bedtime.    Return in about 4 months (around 10/17/2023) for annual.   Crist Fat, MD

## 2023-06-25 NOTE — Assessment & Plan Note (Signed)
He went back on metformin but is not having any problems.  We will check his HgBA1c today.

## 2023-06-25 NOTE — Assessment & Plan Note (Signed)
His BP is doing better since we went up on his lisinopril.  We will continue his current meds.

## 2023-06-26 LAB — LIPID PANEL
Chol/HDL Ratio: 3.1 ratio (ref 0.0–5.0)
Cholesterol, Total: 142 mg/dL (ref 100–199)
HDL: 46 mg/dL (ref >39)
LDL Chol Calc (NIH): 77 mg/dL (ref 0–99)
Triglycerides: 103 mg/dL (ref 0–149)
VLDL Cholesterol Cal: 19 mg/dL (ref 5–40)

## 2023-06-26 LAB — HEMOGLOBIN A1C

## 2023-06-28 ENCOUNTER — Other Ambulatory Visit: Payer: Self-pay

## 2023-06-28 DIAGNOSIS — E7800 Pure hypercholesterolemia, unspecified: Secondary | ICD-10-CM

## 2023-06-28 MED ORDER — PRAVASTATIN SODIUM 40 MG PO TABS
40.0000 mg | ORAL_TABLET | Freq: Every evening | ORAL | 11 refills | Status: DC
Start: 2023-06-28 — End: 2024-07-02

## 2023-06-28 NOTE — Progress Notes (Signed)
New Rx

## 2023-06-28 NOTE — Progress Notes (Signed)
His A1c was cancelled? Tell him that we will recheck it on his next visit. His cholesgerol is not controlled. Please send in a script for pravastatin 40mg  at bedtime.  Patient aware

## 2023-06-28 NOTE — Progress Notes (Signed)
His A1c was cancelled? Tell him that we will recheck it on his next visit. His cholesgerol is not controlled. Please send in a script for pravastatin 40mg  at bedtime.  Called patient, made aware of lab results and instructions on new rx sent in

## 2023-07-06 DIAGNOSIS — K859 Acute pancreatitis without necrosis or infection, unspecified: Secondary | ICD-10-CM | POA: Insufficient documentation

## 2023-07-06 DIAGNOSIS — E1142 Type 2 diabetes mellitus with diabetic polyneuropathy: Secondary | ICD-10-CM | POA: Insufficient documentation

## 2023-07-10 ENCOUNTER — Encounter: Payer: Self-pay | Admitting: Cardiology

## 2023-07-10 ENCOUNTER — Ambulatory Visit: Payer: Medicare Other | Attending: Cardiology | Admitting: Cardiology

## 2023-07-10 VITALS — BP 120/62 | HR 92 | Ht 67.0 in | Wt 295.0 lb

## 2023-07-10 DIAGNOSIS — G4733 Obstructive sleep apnea (adult) (pediatric): Secondary | ICD-10-CM | POA: Diagnosis not present

## 2023-07-10 DIAGNOSIS — E1143 Type 2 diabetes mellitus with diabetic autonomic (poly)neuropathy: Secondary | ICD-10-CM

## 2023-07-10 DIAGNOSIS — Z7984 Long term (current) use of oral hypoglycemic drugs: Secondary | ICD-10-CM | POA: Diagnosis not present

## 2023-07-10 DIAGNOSIS — I5032 Chronic diastolic (congestive) heart failure: Secondary | ICD-10-CM | POA: Diagnosis not present

## 2023-07-10 DIAGNOSIS — I10 Essential (primary) hypertension: Secondary | ICD-10-CM

## 2023-07-10 DIAGNOSIS — R053 Chronic cough: Secondary | ICD-10-CM

## 2023-07-10 MED ORDER — LOSARTAN POTASSIUM 50 MG PO TABS: 50.0000 mg | ORAL_TABLET | Freq: Every day | ORAL | 3 refills | Status: DC

## 2023-07-10 NOTE — Progress Notes (Signed)
Cardiology Office Note:    Date:  07/10/2023   ID:  Samuel Garza, DOB Jun 04, 1934, MRN [id number]  PCP:  Crist Fat, MD  Cardiologist:  Gypsy Balsam, MD    Referring MD: Leonia Reader, Barbara Cower, MD   Chief Complaint  Patient presents with   Cough    Ongoing since 08/2022 Taking Robitussin which help some    History of Present Illness:    Samuel Garza is a 89 y.o. male with past medical history significant for diastolic congestive heart failure, morbid obesity, essential hypertension, prediabetes, dyslipidemia.  Comes today to months for follow-up he is 77 but still works he works mostly convincing on the phone he does not do physical work.  Denies of any chest pain tightness squeezing pressure burning chest.  The problem appears to be cough which is chronic and bothersome.  Past Medical History:  Diagnosis Date   Angina    Cerebrovascular disease    Chest pain at rest    Chronic diastolic congestive heart failure (HCC) 10/31/2016   Diabetes mellitus, type 2 (HCC)    Hypertension    Obesities, morbid (HCC)    Obesity, Class III, BMI 40-49.9 (morbid obesity) (HCC) 09/27/2011   Prediabetes 09/26/2019   Sleep apnea    Stroke [facility])    Vertigo     Past Surgical History:  Procedure Laterality Date   APPENDECTOMY  44   age 24   CARDIAC CATHETERIZATION     GALLBLADDER SURGERY  12/2010   age 75   Left knee arthroscopy  05/2009   age 63    Current Medications: Current Meds  Medication Sig   amLODipine (NORVASC) 5 MG tablet TAKE 1 TABLET(5 MG) BY MOUTH DAILY (Patient taking differently: Take 5 mg by mouth daily. TAKE 1 TABLET(5 MG) BY MOUTH DAILY)   aspirin EC 81 MG tablet Take 81 mg by mouth daily.     carvedilol (COREG) 3.125 MG tablet Take 1 tablet by mouth 2 (two) times daily.   furosemide (LASIX) 40 MG tablet Take 1 tablet (40 mg total) by mouth 2 (two) times daily.   gabapentin (NEURONTIN) 300 MG capsule Take 300 mg by mouth 3 (three) times daily.   glipiZIDE  (GLUCOTROL XL) 5 MG 24 hr tablet TAKE 1 TABLET(5 MG) BY MOUTH DAILY (Patient taking differently: Take 5 mg by mouth daily with breakfast.)   lisinopril (ZESTRIL) 10 MG tablet Take 1 tablet (10 mg total) by mouth daily.   meclizine (ANTIVERT) 25 MG tablet TAKE 1 TABLET(25 MG) BY MOUTH TWICE DAILY AS NEEDED (Patient taking differently: Take 25 mg by mouth 2 (two) times daily as needed for dizziness or nausea.)   metFORMIN (GLUCOPHAGE-XR) 500 MG 24 hr tablet Take 1 tablet (500 mg total) by mouth daily with breakfast.   potassium chloride (KLOR-CON M) 10 MEQ tablet Take 1 tablet (10 mEq total) by mouth daily. Patient needs appointment for further refills.   pravastatin (PRAVACHOL) 40 MG tablet Take 1 tablet (40 mg total) by mouth every evening.   Pseudoephedrine-DM-GG (ROBITUSSIN CF PO) Take 1 Dose by mouth daily as needed (Cough/congestion).   [DISCONTINUED] potassium chloride (KLOR-CON) 10 MEQ tablet Take 1 tablet (10 mEq total) by mouth daily.     Allergies:   Patient has no known allergies.   Social History   Socioeconomic History   Marital status: Married    Spouse name: Not on file   Number of children: 4   Years of education: Not on file  Highest education level: Not on file  Occupational History   Not on file  Tobacco Use   Smoking status: Former   Smokeless tobacco: Never  Vaping Use   Vaping status: Never Used  Substance and Sexual Activity   Alcohol use: No   Drug use: No   Sexual activity: Yes  Other Topics Concern   Not on file  Social History Narrative   Not on file   Social Determinants of Health   Financial Resource Strain: Not on file  Food Insecurity: Not on file  Transportation Needs: Not on file  Physical Activity: Not on file  Stress: Not on file  Social Connections: Not on file     Family History: The patient's family history includes Cancer in his sister; Coronary artery disease in his father; Diabetes in his sister; Heart attack in his brother;  Stroke in his father. ROS:   Please see the history of present illness.    All 14 point review of systems negative except as described per history of present illness  EKGs/Labs/Other Studies Reviewed:         Recent Labs: 10/13/2022: BUN 16; Creatinine, Ser 1.10; Hemoglobin 14.9; Platelets 285; Potassium 3.8; Sodium 138; TSH 3.740 01/03/2023: ALT 20  Recent Lipid Panel    Component Value Date/Time   CHOL 142 06/25/2023 1550   TRIG 103 06/25/2023 1550   HDL 46 06/25/2023 1550   CHOLHDL 3.1 06/25/2023 1550   CHOLHDL 3.9 09/28/2011 0500   VLDL 23 09/28/2011 0500   LDLCALC 77 06/25/2023 1550    Physical Exam:    VS:  BP 120/62 (BP Location: Left Arm, Patient Position: Sitting)   Pulse 92   Ht 5\' 7"  (1.702 m)   Wt 295 lb (133.8 kg)   SpO2 93%   BMI 46.20 kg/m     Wt Readings from Last 3 Encounters:  07/10/23 295 lb (133.8 kg)  06/25/23 297 lb 9.6 oz (135 kg)  03/28/23 297 lb 9.6 oz (135 kg)     GEN:  Well nourished, well developed in no acute distress HEENT: Normal NECK: No JVD; No carotid bruits LYMPHATICS: No lymphadenopathy CARDIAC: RRR, no murmurs, no rubs, no gallops RESPIRATORY:  Clear to auscultation without rales, wheezing or rhonchi  ABDOMEN: Soft, non-tender, non-distended MUSCULOSKELETAL:  No edema; No deformity  SKIN: Warm and dry LOWER EXTREMITIES: no swelling NEUROLOGIC:  Alert and oriented x 3 PSYCHIATRIC:  Normal affect   ASSESSMENT:    1. Chronic diastolic CHF (congestive heart failure) (HCC)   2. Essential hypertension   3. OSA (obstructive sleep apnea)   4. Diabetic autonomic neuropathy associated with type 2 diabetes mellitus (HCC)   5. Morbid obesity (HCC)   6. Chronic cough    PLAN:    In order of problems listed above:  Diastolic congestive heart failure.  Seems to be compensated.  Swelling of lower extremities always there is chronic he is doing quite well overall with this.  Will continue present management. Essential hypertension:  Blood pressure well-controlled continue present management. Dyslipidemia I did review K PN which show me LDL 77 HDL 46 this is from August of this year continue present management. Chronic cough I will switch him from lisinopril to losartan 50 mg daily, Chem-7 will be done next week. Morbid obesity obesity problem he understands he is trying to work on diet.   Medication Adjustments/Labs and Tests Ordered: Current medicines are reviewed at length with the patient today.  Concerns regarding medicines are outlined above.  No orders of the defined types were placed in this encounter.  Medication changes: No orders of the defined types were placed in this encounter.   Signed, Georgeanna Lea, MD, [facility] 07/10/2023 9:40 AM    [facility]

## 2023-07-10 NOTE — Addendum Note (Signed)
Addended by: Baldo Ash D on: 07/10/2023 09:54 AM   Modules accepted: Orders

## 2023-07-10 NOTE — Patient Instructions (Addendum)
Medication Instructions:   STOP: Lisinopril  START: Losartan 50mg  1 tablet daily   Lab Work: Your physician recommends that you return for lab work in: 1 week You need to have labs done when you are fasting.  You can come Monday through Friday 8:30 am to 12:00 pm and 1:15 to 4:30. You do not need to make an appointment as the order has already been placed. The labs you are going to have done are BMET    Testing/Procedures: None Ordered   Follow-Up: At [facility], you and your health needs are our priority.  As part of our continuing mission to provide you with exceptional heart care, we have created designated Provider Care Teams.  These Care Teams include your primary Cardiologist (physician) and Advanced Practice Providers (APPs -  Physician Assistants and Nurse Practitioners) who all work together to provide you with the care you need, when you need it.  We recommend signing up for the patient portal called "MyChart".  Sign up information is provided on this After Visit Summary.  MyChart is used to connect with patients for Virtual Visits (Telemedicine).  Patients are able to view lab/test results, encounter notes, upcoming appointments, etc.  Non-urgent messages can be sent to your provider as well.   To learn more about what you can do with MyChart, go to [email or web address].    Your next appointment:   6 MONTHS  The format for your next appointment:   In Person  Provider:   Gypsy Balsam, MD    Other Instructions NA

## 2023-07-18 DIAGNOSIS — I10 Essential (primary) hypertension: Secondary | ICD-10-CM | POA: Diagnosis not present

## 2023-07-19 LAB — BASIC METABOLIC PANEL
BUN/Creatinine Ratio: 13 (ref 10–24)
BUN: 17 mg/dL (ref 8–27)
CO2: 23 mmol/L (ref 20–29)
Calcium: 9 mg/dL (ref 8.6–10.2)
Chloride: 102 mmol/L (ref 96–106)
Creatinine, Ser: 1.29 mg/dL — ABNORMAL HIGH (ref 0.76–1.27)
Glucose: 106 mg/dL — ABNORMAL HIGH (ref 70–99)
Potassium: 3.9 mmol/L (ref 3.5–5.2)
Sodium: 141 mmol/L (ref 134–144)
eGFR: 54 mL/min/{1.73_m2} — ABNORMAL LOW (ref >59)

## 2023-08-07 ENCOUNTER — Telehealth: Payer: Self-pay

## 2023-08-07 DIAGNOSIS — I5032 Chronic diastolic (congestive) heart failure: Secondary | ICD-10-CM

## 2023-08-07 DIAGNOSIS — I10 Essential (primary) hypertension: Secondary | ICD-10-CM

## 2023-08-07 NOTE — Telephone Encounter (Signed)
Patient notified of results and recommendations and agreed with plan, lab order on file.   

## 2023-08-07 NOTE — Telephone Encounter (Signed)
-----   Message from Gypsy Balsam sent at 07/20/2023 12:15 PM EDT ----- Kidney function slightly worse.  Lets repeat his Chem-7 in 10 days

## 2023-08-16 DIAGNOSIS — I5032 Chronic diastolic (congestive) heart failure: Secondary | ICD-10-CM | POA: Diagnosis not present

## 2023-08-16 DIAGNOSIS — I10 Essential (primary) hypertension: Secondary | ICD-10-CM | POA: Diagnosis not present

## 2023-08-17 LAB — BASIC METABOLIC PANEL
BUN/Creatinine Ratio: 17 (ref 10–24)
BUN: 20 mg/dL (ref 8–27)
CO2: 23 mmol/L (ref 20–29)
Calcium: 8.9 mg/dL (ref 8.6–10.2)
Chloride: 100 mmol/L (ref 96–106)
Creatinine, Ser: 1.2 mg/dL (ref 0.76–1.27)
Glucose: 166 mg/dL — ABNORMAL HIGH (ref 70–99)
Potassium: 4.3 mmol/L (ref 3.5–5.2)
Sodium: 140 mmol/L (ref 134–144)
eGFR: 59 mL/min/{1.73_m2} — ABNORMAL LOW (ref >59)

## 2023-09-05 DIAGNOSIS — L578 Other skin changes due to chronic exposure to nonionizing radiation: Secondary | ICD-10-CM | POA: Diagnosis not present

## 2023-09-05 DIAGNOSIS — L570 Actinic keratosis: Secondary | ICD-10-CM | POA: Diagnosis not present

## 2023-09-05 DIAGNOSIS — C44329 Squamous cell carcinoma of skin of other parts of face: Secondary | ICD-10-CM | POA: Diagnosis not present

## 2023-09-11 ENCOUNTER — Other Ambulatory Visit: Payer: Self-pay | Admitting: Internal Medicine

## 2023-10-03 ENCOUNTER — Other Ambulatory Visit: Payer: Self-pay

## 2023-10-03 MED ORDER — METFORMIN HCL ER 500 MG PO TB24: 500.0000 mg | ORAL_TABLET | Freq: Every day | ORAL | 1 refills | Status: DC

## 2023-10-03 NOTE — Progress Notes (Signed)
Rx Refill

## 2023-10-10 DIAGNOSIS — D0439 Carcinoma in situ of skin of other parts of face: Secondary | ICD-10-CM | POA: Diagnosis not present

## 2023-10-19 ENCOUNTER — Encounter: Payer: Medicare Other | Admitting: Internal Medicine

## 2023-11-22 ENCOUNTER — Other Ambulatory Visit: Payer: Self-pay | Admitting: Cardiology

## 2023-11-22 ENCOUNTER — Other Ambulatory Visit: Payer: Self-pay

## 2023-11-22 MED ORDER — MECLIZINE HCL 25 MG PO TABS: 25.0000 mg | ORAL_TABLET | Freq: Every day | ORAL | 1 refills | Status: DC

## 2023-12-03 ENCOUNTER — Other Ambulatory Visit: Payer: Self-pay

## 2023-12-03 DIAGNOSIS — I5032 Chronic diastolic (congestive) heart failure: Secondary | ICD-10-CM

## 2023-12-03 MED ORDER — GLIPIZIDE ER 5 MG PO TB24: 5.0000 mg | ORAL_TABLET | Freq: Every day | ORAL | 1 refills | Status: DC

## 2023-12-03 NOTE — Progress Notes (Signed)
Rx Refill

## 2023-12-13 DIAGNOSIS — I10 Essential (primary) hypertension: Secondary | ICD-10-CM | POA: Diagnosis not present

## 2023-12-13 DIAGNOSIS — R0602 Shortness of breath: Secondary | ICD-10-CM | POA: Diagnosis not present

## 2023-12-13 DIAGNOSIS — I509 Heart failure, unspecified: Secondary | ICD-10-CM | POA: Diagnosis not present

## 2023-12-13 DIAGNOSIS — J811 Chronic pulmonary edema: Secondary | ICD-10-CM | POA: Diagnosis not present

## 2023-12-13 DIAGNOSIS — R6889 Other general symptoms and signs: Secondary | ICD-10-CM | POA: Diagnosis not present

## 2023-12-13 DIAGNOSIS — I440 Atrioventricular block, first degree: Secondary | ICD-10-CM | POA: Diagnosis not present

## 2023-12-13 DIAGNOSIS — R0989 Other specified symptoms and signs involving the circulatory and respiratory systems: Secondary | ICD-10-CM | POA: Diagnosis not present

## 2023-12-13 DIAGNOSIS — I4510 Unspecified right bundle-branch block: Secondary | ICD-10-CM | POA: Diagnosis not present

## 2023-12-13 DIAGNOSIS — R059 Cough, unspecified: Secondary | ICD-10-CM | POA: Diagnosis not present

## 2023-12-13 DIAGNOSIS — I499 Cardiac arrhythmia, unspecified: Secondary | ICD-10-CM | POA: Diagnosis not present

## 2023-12-13 DIAGNOSIS — R0609 Other forms of dyspnea: Secondary | ICD-10-CM | POA: Diagnosis not present

## 2023-12-13 DIAGNOSIS — R9431 Abnormal electrocardiogram [ECG] [EKG]: Secondary | ICD-10-CM | POA: Diagnosis not present

## 2023-12-14 DIAGNOSIS — Z1152 Encounter for screening for COVID-19: Secondary | ICD-10-CM | POA: Diagnosis not present

## 2023-12-14 DIAGNOSIS — Z7982 Long term (current) use of aspirin: Secondary | ICD-10-CM | POA: Diagnosis not present

## 2023-12-14 DIAGNOSIS — I4510 Unspecified right bundle-branch block: Secondary | ICD-10-CM | POA: Diagnosis not present

## 2023-12-14 DIAGNOSIS — Z7984 Long term (current) use of oral hypoglycemic drugs: Secondary | ICD-10-CM | POA: Diagnosis not present

## 2023-12-14 DIAGNOSIS — R0989 Other specified symptoms and signs involving the circulatory and respiratory systems: Secondary | ICD-10-CM | POA: Diagnosis not present

## 2023-12-14 DIAGNOSIS — G4730 Sleep apnea, unspecified: Secondary | ICD-10-CM | POA: Diagnosis not present

## 2023-12-14 DIAGNOSIS — I352 Nonrheumatic aortic (valve) stenosis with insufficiency: Secondary | ICD-10-CM | POA: Diagnosis not present

## 2023-12-14 DIAGNOSIS — R0609 Other forms of dyspnea: Secondary | ICD-10-CM | POA: Diagnosis not present

## 2023-12-14 DIAGNOSIS — Z8673 Personal history of transient ischemic attack (TIA), and cerebral infarction without residual deficits: Secondary | ICD-10-CM | POA: Diagnosis not present

## 2023-12-14 DIAGNOSIS — I361 Nonrheumatic tricuspid (valve) insufficiency: Secondary | ICD-10-CM | POA: Diagnosis not present

## 2023-12-14 DIAGNOSIS — Z85828 Personal history of other malignant neoplasm of skin: Secondary | ICD-10-CM | POA: Diagnosis not present

## 2023-12-14 DIAGNOSIS — J811 Chronic pulmonary edema: Secondary | ICD-10-CM | POA: Diagnosis not present

## 2023-12-14 DIAGNOSIS — I440 Atrioventricular block, first degree: Secondary | ICD-10-CM | POA: Diagnosis not present

## 2023-12-14 DIAGNOSIS — R9431 Abnormal electrocardiogram [ECG] [EKG]: Secondary | ICD-10-CM | POA: Diagnosis not present

## 2023-12-14 DIAGNOSIS — E119 Type 2 diabetes mellitus without complications: Secondary | ICD-10-CM | POA: Diagnosis not present

## 2023-12-14 DIAGNOSIS — Z79899 Other long term (current) drug therapy: Secondary | ICD-10-CM | POA: Diagnosis not present

## 2023-12-14 DIAGNOSIS — I110 Hypertensive heart disease with heart failure: Secondary | ICD-10-CM | POA: Diagnosis not present

## 2023-12-14 DIAGNOSIS — I5033 Acute on chronic diastolic (congestive) heart failure: Secondary | ICD-10-CM | POA: Diagnosis not present

## 2023-12-14 DIAGNOSIS — Z87891 Personal history of nicotine dependence: Secondary | ICD-10-CM | POA: Diagnosis not present

## 2023-12-14 DIAGNOSIS — K219 Gastro-esophageal reflux disease without esophagitis: Secondary | ICD-10-CM | POA: Diagnosis not present

## 2023-12-14 DIAGNOSIS — I509 Heart failure, unspecified: Secondary | ICD-10-CM | POA: Diagnosis not present

## 2023-12-14 DIAGNOSIS — I10 Essential (primary) hypertension: Secondary | ICD-10-CM | POA: Diagnosis not present

## 2023-12-21 ENCOUNTER — Encounter: Payer: Self-pay | Admitting: Internal Medicine

## 2023-12-21 ENCOUNTER — Ambulatory Visit: Payer: Medicare Other | Admitting: Internal Medicine

## 2023-12-21 VITALS — BP 138/78 | HR 94 | Temp 98.9°F | Resp 18 | Ht 67.0 in | Wt 295.0 lb

## 2023-12-21 DIAGNOSIS — J101 Influenza due to other identified influenza virus with other respiratory manifestations: Secondary | ICD-10-CM | POA: Insufficient documentation

## 2023-12-21 DIAGNOSIS — J189 Pneumonia, unspecified organism: Secondary | ICD-10-CM | POA: Insufficient documentation

## 2023-12-21 NOTE — Progress Notes (Signed)
Office Visit  Subjective   Patient ID: Samuel Garza   DOB: August 18, 1934   Age: 89 y.o.   MRN: [id number]   Chief Complaint Chief Complaint  Patient presents with   Follow-up    Hospital F/U     History of Present Illness Samuel Garza is a 89 yo white male who comes in today for a hospital followup.  He was admitted to [facility] from 12/14/2023 until 12/15/2023 for fevers, chills and SOB.  He was noted to have some CHF with LE edema.  CXR in ER showed a hazziness and possible pulmonary vascular congestion.  His flu testing came back positive for FLU type A.  He was admitted and treated with tamiflu, iv steriods as well as with antibiotics for bacterial pneumonia.  He did not need supplemental oxygen.  They sent him home on tamiflu, prednisone and oral doxycycline.  His BNP was normal.  They repeated his ECHO on 12/14/2023 that showed a normal LV function with LVEF of 55-60% but he had impaired relaxation pattern.  Today, the patient states he has gone back to work.  Today, he has some cough productive of green sputum but no fevers, chills, chest pain, or other problems.     Past Medical History Past Medical History:  Diagnosis Date   Angina    Cerebrovascular disease    Chest pain at rest    Chronic diastolic congestive heart failure (HCC) 10/31/2016   Diabetes mellitus, type 2 (HCC)    Hypertension    Obesities, morbid (HCC)    Obesity, Class III, BMI 40-49.9 (morbid obesity) (HCC) 09/27/2011   Prediabetes 09/26/2019   Sleep apnea    Stroke (HCC)    Vertigo      Allergies No Known Allergies   Medications  Current Outpatient Medications:    amLODipine (NORVASC) 5 MG tablet, TAKE 1 TABLET(5 MG) BY MOUTH DAILY (Patient taking differently: Take 5 mg by mouth daily. TAKE 1 TABLET(5 MG) BY MOUTH DAILY), Disp: 90 tablet, Rfl: 3   aspirin EC 81 MG tablet, Take 81 mg by mouth daily.  , Disp: , Rfl:    carvedilol (COREG) 3.125 MG tablet, Take 1 tablet by mouth 2 (two) times daily., Disp: , Rfl:     furosemide (LASIX) 40 MG tablet, Take 1 tablet (40 mg total) by mouth 2 (two) times daily., Disp: 180 tablet, Rfl: 3   gabapentin (NEURONTIN) 300 MG capsule, Take 300 mg by mouth 3 (three) times daily., Disp: , Rfl:    glipiZIDE (GLUCOTROL XL) 5 MG 24 hr tablet, Take 1 tablet (5 mg total) by mouth daily with breakfast. TAKE 1 TABLET(5 MG) BY MOUTH DAILY, Disp: 90 tablet, Rfl: 1   glucose blood (ONETOUCH VERIO) test strip, USE TO CHECK BLOOD SUGAR, Disp: 50 strip, Rfl: 2   losartan (COZAAR) 50 MG tablet, Take 1 tablet (50 mg total) by mouth daily., Disp: 90 tablet, Rfl: 3   meclizine (ANTIVERT) 25 MG tablet, Take 1 tablet (25 mg total) by mouth daily. TAKE 1 TABLET(25 MG) BY MOUTH TWICE DAILY AS NEEDED, Disp: 90 tablet, Rfl: 1   metFORMIN (GLUCOPHAGE-XR) 500 MG 24 hr tablet, Take 1 tablet (500 mg total) by mouth daily with breakfast., Disp: 90 tablet, Rfl: 1   potassium chloride (KLOR-CON M) 10 MEQ tablet, Take 1 tablet (10 mEq total) by mouth daily. Patient needs appointment for further refills., Disp: 90 tablet, Rfl: 0   pravastatin (PRAVACHOL) 40 MG tablet, Take 1 tablet (40 mg total)  by mouth every evening., Disp: 30 tablet, Rfl: 11   Pseudoephedrine-DM-GG (ROBITUSSIN CF PO), Take 1 Dose by mouth daily as needed (Cough/congestion)., Disp: , Rfl:    Review of Systems Review of Systems  Constitutional:  Negative for chills and fever.  Respiratory:  Positive for cough and sputum production. Negative for shortness of breath and wheezing.   Cardiovascular:  Positive for leg swelling. Negative for chest pain and palpitations.  Gastrointestinal:  Negative for abdominal pain, constipation, diarrhea, nausea and vomiting.  Neurological:  Negative for dizziness, weakness and headaches.       Objective:    Vitals BP 138/78   Pulse 94   Temp 98.9 F (37.2 C)   Resp 18   Ht 5\' 7"  (1.702 m)   Wt 295 lb (133.8 kg)   SpO2 96%   BMI 46.20 kg/m    Physical Examination Physical  Exam Constitutional:      Appearance: Normal appearance. He is not ill-appearing.  Cardiovascular:     Rate and Rhythm: Normal rate and regular rhythm.     Pulses: Normal pulses.     Heart sounds: No murmur heard.    No friction rub. No gallop.  Pulmonary:     Effort: Pulmonary effort is normal. No respiratory distress.     Breath sounds: No wheezing, rhonchi or rales.  Abdominal:     General: Abdomen is flat. Bowel sounds are normal. There is no distension.     Palpations: Abdomen is soft.     Tenderness: There is no abdominal tenderness.  Musculoskeletal:     Right lower leg: No edema.     Left lower leg: No edema.  Skin:    General: Skin is warm and dry.     Findings: No rash.  Neurological:     Mental Status: He is alert.        Assessment & Plan:   Influenza A He has completed the tamiflu at this time.  Pneumonia due to infectious organism He is finishing his steriods and doxycycline.  His lungs are clear today and his oxygen sats are doing well.  He is to continue with supportive care.    No follow-ups on file.   Crist Fat, MD

## 2023-12-21 NOTE — Assessment & Plan Note (Signed)
He is finishing his steriods and doxycycline.  His lungs are clear today and his oxygen sats are doing well.  He is to continue with supportive care.

## 2023-12-21 NOTE — Assessment & Plan Note (Signed)
He has completed the tamiflu at this time.

## 2024-01-18 ENCOUNTER — Encounter: Payer: Self-pay | Admitting: Internal Medicine

## 2024-01-18 ENCOUNTER — Ambulatory Visit: Admitting: Internal Medicine

## 2024-01-18 VITALS — BP 136/84 | HR 96 | Temp 98.9°F | Resp 16 | Ht 67.0 in | Wt 293.6 lb

## 2024-01-18 DIAGNOSIS — R079 Chest pain, unspecified: Secondary | ICD-10-CM

## 2024-01-18 DIAGNOSIS — R918 Other nonspecific abnormal finding of lung field: Secondary | ICD-10-CM | POA: Diagnosis not present

## 2024-01-18 DIAGNOSIS — J811 Chronic pulmonary edema: Secondary | ICD-10-CM | POA: Diagnosis not present

## 2024-01-18 NOTE — Assessment & Plan Note (Signed)
 His EKG shows new onset A. Fib with HR 55-60's range.  He is not having active chest pain or SOB now.  I will send him for a stat CXR and labs including CBC, CMP, troponin I and BNP.  Further care depends on findings.

## 2024-01-18 NOTE — Progress Notes (Signed)
 Office Visit  Subjective   Patient ID: Samuel Garza   DOB: 1933-11-28   Age: 89 y.o.   MRN: [id number]   Chief Complaint No chief complaint on file.    History of Present Illness Samuel Garza is a 89 yo white male with PMHx of T2 diabetes, HTN, HCL who comes in today with complaints of chest pain.  He states that he woke up with right sided chest pain early in the morning.  This was a sharp stabbing pain that lasted a minute or two and there was no associated SOB, palpitations, diaphoresis, nausea, vomiting or syncope.  He got up out of his bed and sat in his recliner and he states that he had a generalized dull aching soreness across his mid chest and eventually went away with rest.  He went back to work 2 days later and has not had a recurrence of this chest pain.  He has been more SOB since that has occurred.  He does have a history of chronic diastolic CHF. He had an ECHO in 11/2016 which showed a normal LVEF 60-65% with LVH and diastolic impairment.  Another ECHO was done in 04/26/2020 which showed a normal LVEF of 60-65% but he had diastolic dysfunction noted.  He had an ECHO from 2023 that showed a LVEF of 55-60% with normal LV diastolic function.  He had another ECHO done when he was admitted to [facility] in 12/2023 .  His last ECHO was done on 12/14/2023 and this showed a LVEF of 55-60%.  There was mild AS, AR, and MR.  He had LV diastolic relaxation impairment.  He has no baseline symptoms of CHF.  He states he has called his cardiologist about this chest pain and he states they have him scheduled in April to be seen.       Past Medical History Past Medical History:  Diagnosis Date   Angina    Cerebrovascular disease    Chest pain at rest    Chronic diastolic congestive heart failure (HCC) 10/31/2016   Diabetes mellitus, type 2 (HCC)    Hypertension    Obesities, morbid (HCC)    Obesity, Class III, BMI 40-49.9 (morbid obesity) (HCC) 09/27/2011   Prediabetes 09/26/2019   Sleep apnea     Stroke (HCC)    Vertigo      Allergies No Known Allergies   Medications  Current Outpatient Medications:    amLODipine (NORVASC) 5 MG tablet, TAKE 1 TABLET(5 MG) BY MOUTH DAILY (Patient taking differently: Take 5 mg by mouth daily. TAKE 1 TABLET(5 MG) BY MOUTH DAILY), Disp: 90 tablet, Rfl: 3   aspirin EC 81 MG tablet, Take 81 mg by mouth daily.  , Disp: , Rfl:    furosemide (LASIX) 40 MG tablet, Take 1 tablet (40 mg total) by mouth 2 (two) times daily., Disp: 180 tablet, Rfl: 3   gabapentin (NEURONTIN) 300 MG capsule, Take 300 mg by mouth 3 (three) times daily., Disp: , Rfl:    glipiZIDE (GLUCOTROL XL) 5 MG 24 hr tablet, Take 1 tablet (5 mg total) by mouth daily with breakfast. TAKE 1 TABLET(5 MG) BY MOUTH DAILY, Disp: 90 tablet, Rfl: 1   glucose blood (ONETOUCH VERIO) test strip, USE TO CHECK BLOOD SUGAR, Disp: 50 strip, Rfl: 2   losartan (COZAAR) 50 MG tablet, Take 1 tablet (50 mg total) by mouth daily., Disp: 90 tablet, Rfl: 3   meclizine (ANTIVERT) 25 MG tablet, Take 1 tablet (25 mg total) by mouth  daily. TAKE 1 TABLET(25 MG) BY MOUTH TWICE DAILY AS NEEDED, Disp: 90 tablet, Rfl: 1   potassium chloride (KLOR-CON M) 10 MEQ tablet, Take 1 tablet (10 mEq total) by mouth daily. Patient needs appointment for further refills., Disp: 90 tablet, Rfl: 0   pravastatin (PRAVACHOL) 40 MG tablet, Take 1 tablet (40 mg total) by mouth every evening., Disp: 30 tablet, Rfl: 11   Review of Systems Review of Systems  Constitutional:  Negative for chills and fever.  Eyes:  Negative for blurred vision and double vision.  Respiratory:  Negative for shortness of breath.   Cardiovascular:  Negative for chest pain, palpitations and leg swelling.  Gastrointestinal:  Negative for abdominal pain, constipation, diarrhea, nausea and vomiting.  Neurological:  Negative for dizziness, weakness and headaches.       Objective:    Vitals BP 136/84   Pulse 96   Temp 98.9 F (37.2 C)   Resp 16   Ht 5\' 7"  (1.702  m)   Wt 293 lb 9.6 oz (133.2 kg)   SpO2 97%   BMI 45.98 kg/m    Physical Examination Physical Exam Constitutional:      Appearance: Normal appearance. He is not ill-appearing.  Cardiovascular:     Rate and Rhythm: Normal rate and regular rhythm.     Pulses: Normal pulses.     Heart sounds: No murmur heard.    No friction rub. No gallop.  Pulmonary:     Effort: Pulmonary effort is normal. No respiratory distress.     Breath sounds: No wheezing, rhonchi or rales.  Abdominal:     General: Abdomen is flat. Bowel sounds are normal. There is no distension.     Palpations: Abdomen is soft.     Tenderness: There is no abdominal tenderness.  Musculoskeletal:     Right lower leg: No edema.     Left lower leg: No edema.  Skin:    General: Skin is warm and dry.     Findings: No rash.  Neurological:     Mental Status: He is alert.        Assessment & Plan:   No problem-specific Assessment & Plan notes found for this encounter.    No follow-ups on file.   Crist Fat, MD

## 2024-01-20 DIAGNOSIS — L0231 Cutaneous abscess of buttock: Secondary | ICD-10-CM | POA: Diagnosis not present

## 2024-01-21 ENCOUNTER — Other Ambulatory Visit: Payer: Self-pay | Admitting: Internal Medicine

## 2024-01-21 DIAGNOSIS — R079 Chest pain, unspecified: Secondary | ICD-10-CM

## 2024-01-22 ENCOUNTER — Encounter: Payer: Self-pay | Admitting: Internal Medicine

## 2024-01-22 ENCOUNTER — Encounter: Payer: Self-pay | Admitting: Cardiology

## 2024-01-22 ENCOUNTER — Ambulatory Visit: Attending: Cardiology | Admitting: Cardiology

## 2024-01-22 VITALS — BP 132/68 | HR 111 | Ht 67.0 in | Wt 292.6 lb

## 2024-01-22 DIAGNOSIS — E119 Type 2 diabetes mellitus without complications: Secondary | ICD-10-CM | POA: Insufficient documentation

## 2024-01-22 DIAGNOSIS — I5032 Chronic diastolic (congestive) heart failure: Secondary | ICD-10-CM | POA: Diagnosis not present

## 2024-01-22 DIAGNOSIS — I10 Essential (primary) hypertension: Secondary | ICD-10-CM

## 2024-01-22 DIAGNOSIS — R079 Chest pain, unspecified: Secondary | ICD-10-CM | POA: Diagnosis not present

## 2024-01-22 DIAGNOSIS — R42 Dizziness and giddiness: Secondary | ICD-10-CM | POA: Insufficient documentation

## 2024-01-22 DIAGNOSIS — R0609 Other forms of dyspnea: Secondary | ICD-10-CM

## 2024-01-22 DIAGNOSIS — E1143 Type 2 diabetes mellitus with diabetic autonomic (poly)neuropathy: Secondary | ICD-10-CM | POA: Diagnosis not present

## 2024-01-22 NOTE — Patient Instructions (Signed)
 Medication Instructions:  Your physician recommends that you continue on your current medications as directed. Please refer to the Current Medication list given to you today.  *If you need a refill on your cardiac medications before your next appointment, please call your pharmacy*   Lab Work: None ordered If you have labs (blood work) drawn today and your tests are completely normal, you will receive your results only by: MyChart Message (if you have MyChart) OR A paper copy in the mail If you have any lab test that is abnormal or we need to change your treatment, we will call you to review the results.   Testing/Procedures: You are scheduled for a Myocardial Perfusion Imaging Study.  Please arrive 15 minutes prior to your appointment time for registration and insurance purposes.  The test will take approximately 3 to 4 hours to complete; you may bring reading material.  If someone comes with you to your appointment, they will need to remain in the main lobby due to limited space in the testing area.   How to prepare for your Myocardial Perfusion Test: Do not eat or drink 3 hours prior to your test, except you may have water. Do not consume products containing caffeine (regular or decaffeinated) 12 hours prior to your test. (ex: coffee, chocolate, sodas, tea).  Do wear comfortable clothes (no dresses or overalls) and walking shoes, tennis shoes preferred (No heels or open toe shoes are allowed). Do NOT wear cologne, perfume, aftershave, or lotions (deodorant is allowed). If these instructions are not followed, your test will have to be rescheduled.  If you cannot keep your appointment, please provide 24 hours notification to the Nuclear Lab, to avoid a possible $50 charge to your account.  Follow-Up: At [facility], you and your health needs are our priority.  As part of our continuing mission to provide you with exceptional heart care, we have created designated Provider  Care Teams.  These Care Teams include your primary Cardiologist (physician) and Advanced Practice Providers (APPs -  Physician Assistants and Nurse Practitioners) who all work together to provide you with the care you need, when you need it.  We recommend signing up for the patient portal called "MyChart".  Sign up information is provided on this After Visit Summary.  MyChart is used to connect with patients for Virtual Visits (Telemedicine).  Patients are able to view lab/test results, encounter notes, upcoming appointments, etc.  Non-urgent messages can be sent to your provider as well.   To learn more about what you can do with MyChart, go to [email or web address].    Your next appointment:   6 month follow up  Other Instructions  Cardiac Nuclear Scan A cardiac nuclear scan is a test that is done to check the flow of blood to your heart. It is done when you are resting and when you are exercising. The test looks for problems such as: Not enough blood reaching a portion of the heart. The heart muscle not working as it should. You may need this test if you have: Heart disease. Lab results that are not normal. Had heart surgery or a balloon procedure to open up blocked arteries (angioplasty) or a small mesh tube (stent). Chest pain. Shortness of breath. Had a heart attack. In this test, a special dye (tracer) is put into your bloodstream. The tracer will travel to your heart. A camera will then take pictures of your heart to see how the tracer moves through your heart. This test is  usually done at a hospital and takes 2-4 hours. Tell a doctor about: Any allergies you have. All medicines you are taking, including vitamins, herbs, eye drops, creams, and over-the-counter medicines. Any bleeding problems you have. Any surgeries you have had. Any medical conditions you have. Whether you are pregnant or may be pregnant. Any history of asthma or long-term (chronic) lung disease. Any history  of heart rhythm disorders or heart valve conditions. What are the risks? Your doctor will talk with you about risks. These may include: Serious chest pain and heart attack. This is only a risk if the stress portion of the test is done. Fast or uneven heartbeats (palpitations). A feeling of warmth in your chest. This feeling usually does not last long. Allergic reaction to the tracer. Shortness of breath or trouble breathing. What happens before the test? Ask your doctor about changing or stopping your normal medicines. Follow instructions from your doctor about what you cannot eat or drink. Remove your jewelry on the day of the test. Ask your doctor if you need to avoid nicotine or caffeine. What happens during the test? An IV tube will be inserted into one of your veins. Your doctor will give you a small amount of tracer through the IV tube. You will wait for 20-40 minutes while the tracer moves through your bloodstream. Your heart will be monitored with an electrocardiogram (ECG). You will lie down on an exam table. Pictures of your heart will be taken for about 15-20 minutes. You may also have a stress test. For this test, one of these things may be done: You will be asked to exercise on a treadmill or a stationary bike. You will be given medicines that will make your heart work harder. This is done if you are unable to exercise. When blood flow to your heart has peaked, a tracer will again be given through the IV tube. After 20-40 minutes, you will get back on the exam table. More pictures will be taken of your heart. Depending on the tracer that is used, more pictures may need to be taken 3-4 hours later. Your IV tube will be removed when the test is over. The test may vary among doctors and hospitals. What happens after the test? Ask your doctor: Whether you can return to your normal schedule, including diet, activities, travel, and medicines. Whether you should drink more  fluids. This will help to remove the tracer from your body. Ask your doctor, or the department that is doing the test: When will my results be ready? How will I get my results? What are my treatment options? What other tests do I need? What are my next steps? This information is not intended to replace advice given to you by your health care provider. Make sure you discuss any questions you have with your health care provider. Document Revised: 03/21/2022 Document Reviewed: 03/21/2022 Elsevier Patient Education  2023 [employer].

## 2024-01-22 NOTE — Addendum Note (Signed)
 Addended by: Lonia Farber on: 01/22/2024 11:08 AM   Modules accepted: Orders

## 2024-01-22 NOTE — Progress Notes (Signed)
 Cardiology Office Note:    Date:  01/22/2024   ID:  Samuel Garza, DOB 04-06-1934, MRN [id number]  PCP:  Crist Fat, MD  Cardiologist:  Gypsy Balsam, MD    Referring MD: Leonia Reader, Barbara Cower, MD   No chief complaint on file.   History of Present Illness:    Samuel Garza is a 89 y.o. male past medical history significant for diastolic congestive heart failure, essential hypertension, prediabetes, dyslipidemia.  Recently he ended up having chest pain.  He went to the emergency room troponins were negative.  Also complained of having LB more shortness of breath than previously.  He also was noted to have abscess that being recently drained.  Initially when I talk to him he is not exactly sure why he is here he said his doctor sent him here but he is not exactly sure why couple minutes later he recalled that the reason for his visit was chest pain.  He had difficulty recalling it what he is telling me is the fact that he was on the right side of his chest was not related to exercise actually awaken him in the middle of the night  Past Medical History:  Diagnosis Date   Angina    Cerebrovascular disease    Chest pain at rest    Chronic diastolic congestive heart failure (HCC) 10/31/2016   Diabetes mellitus, type 2 (HCC)    Hypertension    Obesities, morbid (HCC)    Obesity, Class III, BMI 40-49.9 (morbid obesity) (HCC) 09/27/2011   Prediabetes 09/26/2019   Sleep apnea    Stroke [facility])    Vertigo     Past Surgical History:  Procedure Laterality Date   APPENDECTOMY  19   age 89   CARDIAC CATHETERIZATION     GALLBLADDER SURGERY  12/2010   age 19   Left knee arthroscopy  05/2009   age 52    Current Medications: Current Meds  Medication Sig   amLODipine (NORVASC) 5 MG tablet TAKE 1 TABLET(5 MG) BY MOUTH DAILY   aspirin EC 81 MG tablet Take 81 mg by mouth daily.     clindamycin (CLEOCIN) 300 MG capsule Take 300 mg by mouth 3 (three) times daily.   doxycycline (VIBRA-TABS)  100 MG tablet Take 100 mg by mouth 2 (two) times daily.   furosemide (LASIX) 40 MG tablet Take 1 tablet (40 mg total) by mouth 2 (two) times daily.   gabapentin (NEURONTIN) 300 MG capsule Take 300 mg by mouth 3 (three) times daily.   glipiZIDE (GLUCOTROL XL) 5 MG 24 hr tablet Take 1 tablet (5 mg total) by mouth daily with breakfast. TAKE 1 TABLET(5 MG) BY MOUTH DAILY   glucose blood (ONETOUCH VERIO) test strip USE TO CHECK BLOOD SUGAR   ibuprofen (ADVIL) 800 MG tablet Take 800 mg by mouth 3 (three) times daily.   lisinopril (ZESTRIL) 10 MG tablet Take 10 mg by mouth daily.   meclizine (ANTIVERT) 25 MG tablet Take 1 tablet (25 mg total) by mouth daily. TAKE 1 TABLET(25 MG) BY MOUTH TWICE DAILY AS NEEDED   potassium chloride (KLOR-CON M) 10 MEQ tablet Take 1 tablet (10 mEq total) by mouth daily. Patient needs appointment for further refills.   potassium chloride (KLOR-CON) 10 MEQ tablet Take 10 mEq by mouth daily.   pravastatin (PRAVACHOL) 40 MG tablet Take 1 tablet (40 mg total) by mouth every evening.     Allergies:   Patient has no known allergies.  Social History   Socioeconomic History   Marital status: Married    Spouse name: Not on file   Number of children: 4   Years of education: Not on file   Highest education level: Not on file  Occupational History   Not on file  Tobacco Use   Smoking status: Former   Smokeless tobacco: Never  Vaping Use   Vaping status: Never Used  Substance and Sexual Activity   Alcohol use: No   Drug use: No   Sexual activity: Yes  Other Topics Concern   Not on file  Social History Narrative   Not on file   Social Drivers of Health   Financial Resource Strain: Not on file  Food Insecurity: Not on file  Transportation Needs: Not on file  Physical Activity: Not on file  Stress: Not on file  Social Connections: Not on file     Family History: The patient's family history includes Cancer in his sister; Coronary artery disease in his father;  Diabetes in his sister; Heart attack in his brother; Stroke in his father. ROS:   Please see the history of present illness.    All 14 point review of systems negative except as described per history of present illness  EKGs/Labs/Other Studies Reviewed:         Recent Labs: 08/16/2023: BUN 20; Creatinine, Ser 1.20; Potassium 4.3; Sodium 140  Recent Lipid Panel    Component Value Date/Time   CHOL 142 06/25/2023 1550   TRIG 103 06/25/2023 1550   HDL 46 06/25/2023 1550   CHOLHDL 3.1 06/25/2023 1550   CHOLHDL 3.9 09/28/2011 0500   VLDL 23 09/28/2011 0500   LDLCALC 77 06/25/2023 1550    Physical Exam:    VS:  BP 132/68   Pulse (!) 111   Ht 5\' 7"  (1.702 m)   Wt 292 lb 9.6 oz (132.7 kg)   SpO2 94%   BMI 45.83 kg/m     Wt Readings from Last 3 Encounters:  01/22/24 292 lb 9.6 oz (132.7 kg)  01/18/24 293 lb 9.6 oz (133.2 kg)  12/21/23 295 lb (133.8 kg)     GEN:  Well nourished, well developed in no acute distress HEENT: Normal NECK: No JVD; No carotid bruits LYMPHATICS: No lymphadenopathy CARDIAC: RRR, no murmurs, no rubs, no gallops RESPIRATORY:  Clear to auscultation without rales, wheezing or rhonchi  ABDOMEN: Soft, non-tender, non-distended MUSCULOSKELETAL:  No edema; No deformity  SKIN: Warm and dry LOWER EXTREMITIES: no swelling NEUROLOGIC:  Alert and oriented x 3 PSYCHIATRIC:  Normal affect   ASSESSMENT:    1. Vertigo   2. Chest pain, unspecified type   3. Chronic diastolic CHF (congestive heart failure) (HCC)   4. Essential hypertension   5. Diabetic autonomic neuropathy associated with type 2 diabetes mellitus (HCC)   6. Dyspnea on exertion    PLAN:    In order of problems listed above:  Chest pain somewhat atypical we will schedule him to have Lexiscan to make sure he does not have any inducible ischemia. Chronic diastolic congestive heart failure, weight is stable swelling is as usual, echocardiogram reviewed from the hospital showing preserved  ejection fraction. Essential hypertension blood pressure well-controlled continue present management. Diabetes that being followed by antimedicine team. Rhythm question his EKG today show questionable atrial flutter however I did repeat EKG which showed normal sinus rhythm with first-degree AV block   Medication Adjustments/Labs and Tests Ordered: Current medicines are reviewed at length with the patient today.  Concerns regarding medicines are outlined above.  Orders Placed This Encounter  Procedures   EKG 12-Lead   Medication changes: No orders of the defined types were placed in this encounter.   Signed, Georgeanna Lea, MD, [facility] 01/22/2024 10:49 AM    [facility]

## 2024-01-30 ENCOUNTER — Telehealth (HOSPITAL_COMMUNITY): Payer: Self-pay | Admitting: *Deleted

## 2024-01-30 NOTE — Telephone Encounter (Signed)
 Patient's wife given detailed instructions per Myocardial Perfusion Study Information Sheet for the test on 02/05/24 at 8:15. Patient notified to arrive 15 minutes early and that it is imperative to arrive on time for appointment to keep from having the test rescheduled.  If you need to cancel or reschedule your appointment, please call the office within 24 hours of your appointment. . Patient's wife verbalized understanding.Daneil Dolin

## 2024-02-04 ENCOUNTER — Other Ambulatory Visit: Payer: Self-pay | Admitting: Cardiology

## 2024-02-05 ENCOUNTER — Ambulatory Visit: Attending: Cardiology

## 2024-02-05 DIAGNOSIS — R079 Chest pain, unspecified: Secondary | ICD-10-CM | POA: Diagnosis not present

## 2024-02-05 MED ORDER — TECHNETIUM TC 99M TETROFOSMIN IV KIT
30.1000 | PACK | Freq: Once | INTRAVENOUS | Status: AC | PRN
  Administered 2024-02-05: 30.1 via INTRAVENOUS

## 2024-02-05 MED ORDER — REGADENOSON 0.4 MG/5ML IV SOLN
0.4000 mg | Freq: Once | INTRAVENOUS | Status: AC
  Administered 2024-02-05: 0.4 mg via INTRAVENOUS

## 2024-02-05 NOTE — Telephone Encounter (Signed)
 Prescription sent to pharmacy.

## 2024-02-06 ENCOUNTER — Ambulatory Visit: Attending: Cardiology

## 2024-02-06 LAB — MYOCARDIAL PERFUSION IMAGING
LV dias vol: 88 mL (ref 62–150)
LV sys vol: 27 mL
Nuc Stress EF: 70 %
Peak HR: 102 {beats}/min
Rest HR: 94 {beats}/min
Rest Nuclear Isotope Dose: 32.8 mCi
SDS: 0
SRS: 0
SSS: 0
Stress Nuclear Isotope Dose: 30.1 mCi
TID: 1.03

## 2024-02-06 MED ORDER — TECHNETIUM TC 99M TETROFOSMIN IV KIT
32.8000 | PACK | Freq: Once | INTRAVENOUS | Status: AC | PRN
  Administered 2024-02-06: 32.8 via INTRAVENOUS

## 2024-02-07 ENCOUNTER — Telehealth: Payer: Self-pay

## 2024-02-07 NOTE — Telephone Encounter (Signed)
-----   Message from Gypsy Balsam sent at 02/07/2024  1:45 PM EDT ----- Stress test negative

## 2024-02-07 NOTE — Telephone Encounter (Signed)
 Patient notified of results and verbalized understanding.

## 2024-02-14 ENCOUNTER — Other Ambulatory Visit: Payer: Self-pay | Admitting: Student

## 2024-02-14 ENCOUNTER — Other Ambulatory Visit: Payer: Self-pay | Admitting: Cardiology

## 2024-02-23 ENCOUNTER — Other Ambulatory Visit: Payer: Self-pay | Admitting: Cardiology

## 2024-02-25 NOTE — Telephone Encounter (Signed)
 Rx refill sent to pharmacy.

## 2024-03-03 ENCOUNTER — Ambulatory Visit: Admitting: Cardiology

## 2024-03-27 ENCOUNTER — Other Ambulatory Visit: Payer: Self-pay

## 2024-03-27 ENCOUNTER — Other Ambulatory Visit: Payer: Self-pay | Admitting: Internal Medicine

## 2024-03-27 MED ORDER — BLOOD GLUCOSE MONITOR KIT: PACK | 0 refills | Status: DC

## 2024-04-01 ENCOUNTER — Other Ambulatory Visit: Payer: Self-pay

## 2024-04-01 DIAGNOSIS — E1100 Type 2 diabetes mellitus with hyperosmolarity without nonketotic hyperglycemic-hyperosmolar coma (NKHHC): Secondary | ICD-10-CM

## 2024-04-01 NOTE — Progress Notes (Signed)
 Rx

## 2024-04-04 ENCOUNTER — Other Ambulatory Visit: Payer: Self-pay

## 2024-04-04 MED ORDER — BLOOD GLUCOSE MONITOR KIT: PACK | 0 refills | Status: DC

## 2024-04-08 ENCOUNTER — Other Ambulatory Visit: Payer: Self-pay

## 2024-04-08 MED ORDER — BLOOD GLUCOSE MONITORING SUPPL DEVI: 1.0000 | Freq: Three times a day (TID) | 0 refills | Status: AC

## 2024-04-08 MED ORDER — BLOOD GLUCOSE TEST VI STRP: 1.0000 | ORAL_STRIP | Freq: Three times a day (TID) | 0 refills | Status: AC

## 2024-04-08 MED ORDER — LANCET DEVICE MISC: 1.0000 | Freq: Three times a day (TID) | 0 refills | Status: AC

## 2024-04-08 MED ORDER — BLOOD GLUCOSE MONITOR KIT: PACK | 0 refills | Status: DC

## 2024-04-08 MED ORDER — LANCETS MISC. MISC: 1.0000 | Freq: Three times a day (TID) | 0 refills | Status: AC

## 2024-04-08 NOTE — Progress Notes (Signed)
 Rx Glucose Monitor kit

## 2024-05-10 ENCOUNTER — Other Ambulatory Visit: Payer: Self-pay | Admitting: Internal Medicine

## 2024-06-11 ENCOUNTER — Other Ambulatory Visit: Payer: Self-pay | Admitting: Internal Medicine

## 2024-06-11 DIAGNOSIS — I5032 Chronic diastolic (congestive) heart failure: Secondary | ICD-10-CM

## 2024-06-25 ENCOUNTER — Other Ambulatory Visit: Payer: Self-pay | Admitting: Internal Medicine

## 2024-07-02 ENCOUNTER — Ambulatory Visit: Admitting: Internal Medicine

## 2024-07-02 ENCOUNTER — Encounter: Payer: Self-pay | Admitting: Internal Medicine

## 2024-07-02 NOTE — Progress Notes (Unsigned)
 Office Visit  Subjective   Patient ID: Samuel Garza   DOB: [birth year]   Age: 89 y.o.   MRN: [id number]   Chief Complaint Chief Complaint  Patient presents with   Cerumen Impaction    Pt reports he feels like he needs his ears cleaned out.     History of Present Illness Samuel Garza is a 89 yo male who comes in today with humming in his ears and decreased hearing.  This started yesterday and has been continuous.  It occurred a few weeks ago but went almost immediately away.  There is no ear pain,  sinus congestion, runny nose, fevers, chills or other problems.     Past Medical History Past Medical History:  Diagnosis Date   Angina    Cerebrovascular disease    Chest pain at rest    Chronic diastolic congestive heart failure (HCC) 10/31/2016   Diabetes mellitus, type 2 (HCC)    Hypertension    Obesities, morbid (HCC)    Obesity, Class III, BMI 40-49.9 (morbid obesity) 09/27/2011   Prediabetes 09/26/2019   Sleep apnea    Stroke (HCC)    Vertigo      Allergies No Known Allergies   Medications  Current Outpatient Medications:    amLODipine  (NORVASC ) 5 MG tablet, TAKE 1 TABLET(5 MG) BY MOUTH DAILY, Disp: 90 tablet, Rfl: 3   aspirin  EC 81 MG tablet, Take 81 mg by mouth daily.  , Disp: , Rfl:    Blood Glucose Monitoring Suppl DEVI, 1 each by Does not apply route in the morning, at noon, and at bedtime. May substitute to any manufacturer covered by patient's insurance., Disp: 1 each, Rfl: 0   doxycycline  (VIBRA -TABS) 100 MG tablet, Take 100 mg by mouth 2 (two) times daily., Disp: , Rfl:    furosemide  (LASIX ) 40 MG tablet, Take 1 tablet (40 mg total) by mouth 2 (two) times daily., Disp: 180 tablet, Rfl: 3   gabapentin (NEURONTIN) 300 MG capsule, Take 300 mg by mouth 3 (three) times daily., Disp: , Rfl:    glipiZIDE  (GLUCOTROL  XL) 5 MG 24 hr tablet, TAKE 1 TABLET(5 MG) BY MOUTH DAILY WITH BREAKFAST, Disp: 90 tablet, Rfl: 1   glucose blood (ONETOUCH VERIO) test strip, USE TO  CHECK BLOOD SUGAR IN THE MORNING, NOON AND NIGHT, Disp: 100 strip, Rfl: 0   lisinopril  (ZESTRIL ) 10 MG tablet, TAKE 1 TABLET(10 MG) BY MOUTH DAILY, Disp: 90 tablet, Rfl: 1   losartan  (COZAAR ) 50 MG tablet, Take 1 tablet (50 mg total) by mouth daily., Disp: 90 tablet, Rfl: 3   meclizine  (ANTIVERT ) 25 MG tablet, TAKE 1 TABLET BY MOUTH TWICE DAILY AS NEEDED, Disp: 90 tablet, Rfl: 1   metFORMIN  (GLUCOPHAGE -XR) 500 MG 24 hr tablet, TAKE 1 TABLET(500 MG) BY MOUTH DAILY WITH BREAKFAST, Disp: 90 tablet, Rfl: 1   potassium chloride  (KLOR-CON ) 10 MEQ tablet, Take 1 tablet (10 mEq total) by mouth daily., Disp: 90 tablet, Rfl: 3   Review of Systems ROS     Objective:    Vitals BP 128/70   Pulse 94   Temp 97.7 F (36.5 C) (Temporal)   Resp 20   Ht 5' 7 (1.702 m)   Wt (!) 301 lb 6.4 oz (136.7 kg)   SpO2 96%   BMI 47.21 kg/m    Physical Examination Physical Exam     Assessment & Plan:   No problem-specific Assessment & Plan notes found for this encounter.    No follow-ups on  file.   Selinda Fleeta Finger, MD

## 2024-07-03 ENCOUNTER — Other Ambulatory Visit: Payer: Self-pay | Admitting: Internal Medicine

## 2024-07-03 DIAGNOSIS — E7800 Pure hypercholesterolemia, unspecified: Secondary | ICD-10-CM

## 2024-07-06 ENCOUNTER — Other Ambulatory Visit: Payer: Self-pay | Admitting: Cardiology

## 2024-07-22 ENCOUNTER — Ambulatory Visit: Admitting: Podiatry

## 2024-07-22 ENCOUNTER — Encounter: Payer: Self-pay | Admitting: Podiatry

## 2024-07-22 VITALS — Ht 67.0 in | Wt 301.0 lb

## 2024-07-22 DIAGNOSIS — B351 Tinea unguium: Secondary | ICD-10-CM | POA: Diagnosis not present

## 2024-07-22 DIAGNOSIS — M79671 Pain in right foot: Secondary | ICD-10-CM | POA: Diagnosis not present

## 2024-07-22 DIAGNOSIS — M79672 Pain in left foot: Secondary | ICD-10-CM | POA: Diagnosis not present

## 2024-07-22 NOTE — Progress Notes (Signed)
 Patient presents for evaluation and treatment of tenderness and some redness around nails feet.  Tenderness around toes with walking and wearing shoes.  Physical exam:  General appearance: Alert, pleasant, and in no acute distress.  Vascular: Pedal pulses: DP 2/4 B/L, PT 1/4 B/L.  Moderate to severe edema lower legs bilaterally.  Capillary refill time immediate bilaterally  Neu  Dermatologic:  Nails thickened, disfigured, discolored 1-5 BL with subungual debris.  Redness and hypertrophic nail folds along nail folds bilaterally but no signs of drainage or infection.  Hallux nails bilaterally are extremely thickened and dystrophic with redness and hypertrophy along the nail folds.  Some dried  blood present around the nail folds and under the nail plate.  Nail spicule regrowth where nail surgery was done on the hallux left several years ago.  Musculoskeletal:     Diagnosis: 1. Painful onychomycotic nails 1 through 5 bilaterally. 2. Pain toes 1 through 5 bilaterally.  Plan: -Debrided onychomycotic nails 1 through 5 bilaterally.  Sharply debrided nails with nail clipper and reduced with a power bur. -Discussed with the ingrown dystrophic gryphotic nails that are giving him problems on the hallux bilaterally.  Recommended permanent removal of these.  Will do this in the next several weeks   -Return in 1 week for matrixectomy hallux nail right, then matricectomy hallux nail left -Return 3 months RFC

## 2024-08-05 ENCOUNTER — Ambulatory Visit: Admitting: Podiatry

## 2024-08-13 ENCOUNTER — Encounter: Payer: Self-pay | Admitting: Podiatry

## 2024-08-13 ENCOUNTER — Ambulatory Visit: Admitting: Podiatry

## 2024-08-13 DIAGNOSIS — L600 Ingrowing nail: Secondary | ICD-10-CM

## 2024-08-13 NOTE — Progress Notes (Signed)
 Patient complains of painful ingrown hallux right .  Thickened painful nail.  Patient denies fevers, chills, nausea, vomiting.  Objective:  Vitals: Reviewed  General: Well developed, nourished, in no acute distress, alert and oriented x3   Vascular: DP pulse 2/4 bilateral. PT pulse 1/4 bilateral.  Moderate to severe edema lower legs bilaterally  Dermatology: Erythema, edema, incurvated nail border both hallux right with onychogrphosis with mild drainage . Tenderness present with palpation. Normal skin tone and texture feet with normal hair growth.  Neurological: Grossly intact. Normal reflexes.   Musculoskeletal: Tenderness with palpation of the distal hallux right. No tenderness or painful ROM at IPJ.  Diagnosis: Ingrown nail 1 right  Plan: -discussed etiology and treatment of ingrown nails. Discussed surgical vs conservative treatment. -Consent signed for appropriate matrixectomy affected nail(s).   Procedure(s):   - Matrixectomy(s) total 1 right: Toe anesthetized with 3cc 2:1 mixture 2% Lidocaine with epinephrine: Sodium Bicarbonate. Surgical site prepped. Digital tourniquet applied.  Avulsion of nail plate. performed. Matrixecomy performed with three 30 second applications of phenol to nail matrix. Site irrigated with alcohol.  Tourniquet released with good vascularity noticed in digit.  Applied triple antibiotic to nailbed and applied gauze and Coban dressing. - Written and oral postoperative instructions given.  -Return for post-op 2 weeks and matricectomy hallux nail left  J Prentice Binder, DPM

## 2024-08-13 NOTE — Patient Instructions (Signed)

## 2024-08-28 ENCOUNTER — Ambulatory Visit: Admitting: Podiatry

## 2024-10-21 ENCOUNTER — Ambulatory Visit: Admitting: Podiatry
